# Patient Record
Sex: Female | Born: 1980 | Race: Asian | Hispanic: No | Marital: Married | Smoking: Never smoker
Health system: Southern US, Community
[De-identification: ages and names within clinical notes are randomized; demographics above are authoritative.]

## PROBLEM LIST (undated history)

## (undated) ENCOUNTER — Ambulatory Visit (HOSPITAL_COMMUNITY): Admission: EM | Payer: Medicaid Other | Source: Home / Self Care

## (undated) ENCOUNTER — Inpatient Hospital Stay (HOSPITAL_COMMUNITY): Payer: Self-pay

## (undated) DIAGNOSIS — M79606 Pain in leg, unspecified: Secondary | ICD-10-CM

## (undated) DIAGNOSIS — M549 Dorsalgia, unspecified: Secondary | ICD-10-CM

## (undated) DIAGNOSIS — R102 Pelvic and perineal pain: Secondary | ICD-10-CM

## (undated) DIAGNOSIS — Z789 Other specified health status: Secondary | ICD-10-CM

## (undated) DIAGNOSIS — M545 Low back pain: Secondary | ICD-10-CM

## (undated) DIAGNOSIS — G8929 Other chronic pain: Secondary | ICD-10-CM

## (undated) DIAGNOSIS — E079 Disorder of thyroid, unspecified: Secondary | ICD-10-CM

## (undated) HISTORY — DX: Other chronic pain: G89.29

## (undated) HISTORY — DX: Pain in leg, unspecified: M79.606

## (undated) HISTORY — DX: Dorsalgia, unspecified: M54.9

## (undated) HISTORY — DX: Pelvic and perineal pain: R10.2

## (undated) HISTORY — DX: Low back pain: M54.5

---

## 2012-02-26 ENCOUNTER — Emergency Department (HOSPITAL_COMMUNITY): Admission: EM | Admit: 2012-02-26 | Discharge: 2012-02-26 | Discharge status: Absent without leave | Payer: Self-pay

## 2013-12-06 ENCOUNTER — Encounter (HOSPITAL_COMMUNITY): Payer: Self-pay | Admitting: Emergency Medicine

## 2013-12-06 ENCOUNTER — Emergency Department (HOSPITAL_COMMUNITY): Payer: Self-pay

## 2013-12-06 ENCOUNTER — Emergency Department (HOSPITAL_COMMUNITY)
Admission: EM | Admit: 2013-12-06 | Discharge: 2013-12-06 | Disposition: A | Discharge status: Home / Self Care | Payer: Self-pay | Attending: Emergency Medicine | Admitting: Emergency Medicine

## 2013-12-06 DIAGNOSIS — M545 Low back pain, unspecified: Secondary | ICD-10-CM | POA: Insufficient documentation

## 2013-12-06 DIAGNOSIS — Z3202 Encounter for pregnancy test, result negative: Secondary | ICD-10-CM | POA: Insufficient documentation

## 2013-12-06 DIAGNOSIS — R109 Unspecified abdominal pain: Secondary | ICD-10-CM | POA: Insufficient documentation

## 2013-12-06 LAB — POC URINE PREG, ED: Preg Test, Ur: NEGATIVE

## 2013-12-06 LAB — CBC WITH DIFFERENTIAL/PLATELET
BASOS ABS: 0 10*3/uL (ref 0.0–0.1)
Basophils Relative: 0 % (ref 0–1)
Eosinophils Absolute: 0.4 10*3/uL (ref 0.0–0.7)
Eosinophils Relative: 3 % (ref 0–5)
HEMATOCRIT: 32.4 % — AB (ref 36.0–46.0)
Hemoglobin: 10.5 g/dL — ABNORMAL LOW (ref 12.0–15.0)
LYMPHS PCT: 16 % (ref 12–46)
Lymphs Abs: 1.8 10*3/uL (ref 0.7–4.0)
MCH: 26.4 pg (ref 26.0–34.0)
MCHC: 32.4 g/dL (ref 30.0–36.0)
MCV: 81.6 fL (ref 78.0–100.0)
MONO ABS: 0.7 10*3/uL (ref 0.1–1.0)
Monocytes Relative: 6 % (ref 3–12)
NEUTROS ABS: 8.7 10*3/uL — AB (ref 1.7–7.7)
NEUTROS PCT: 75 % (ref 43–77)
Platelets: 286 10*3/uL (ref 150–400)
RBC: 3.97 MIL/uL (ref 3.87–5.11)
RDW: 14.3 % (ref 11.5–15.5)
WBC: 11.5 10*3/uL — AB (ref 4.0–10.5)

## 2013-12-06 LAB — URINALYSIS, ROUTINE W REFLEX MICROSCOPIC
Bilirubin Urine: NEGATIVE
GLUCOSE, UA: NEGATIVE mg/dL
HGB URINE DIPSTICK: NEGATIVE
KETONES UR: NEGATIVE mg/dL
Leukocytes, UA: NEGATIVE
Nitrite: NEGATIVE
PH: 6.5 (ref 5.0–8.0)
PROTEIN: NEGATIVE mg/dL
Specific Gravity, Urine: 1.001 — ABNORMAL LOW (ref 1.005–1.030)
Urobilinogen, UA: 0.2 mg/dL (ref 0.0–1.0)

## 2013-12-06 LAB — COMPREHENSIVE METABOLIC PANEL
ALK PHOS: 50 U/L (ref 39–117)
ALT: 12 U/L (ref 0–35)
AST: 16 U/L (ref 0–37)
Albumin: 4 g/dL (ref 3.5–5.2)
BUN: 7 mg/dL (ref 6–23)
CHLORIDE: 99 meq/L (ref 96–112)
CO2: 25 meq/L (ref 19–32)
Calcium: 9.4 mg/dL (ref 8.4–10.5)
Creatinine, Ser: 0.57 mg/dL (ref 0.50–1.10)
GFR calc Af Amer: >90 mL/min (ref >90)
Glucose, Bld: 131 mg/dL — ABNORMAL HIGH (ref 70–99)
POTASSIUM: 3.8 meq/L (ref 3.7–5.3)
Sodium: 137 mEq/L (ref 137–147)
Total Bilirubin: 0.3 mg/dL (ref 0.3–1.2)
Total Protein: 7.4 g/dL (ref 6.0–8.3)

## 2013-12-06 LAB — LIPASE, BLOOD: Lipase: 41 U/L (ref 11–59)

## 2013-12-06 MED ORDER — NAPROXEN 500 MG PO TABS: 500.0000 mg | ORAL_TABLET | Freq: Two times a day (BID) | ORAL | Status: DC

## 2013-12-06 MED ORDER — FAMOTIDINE 20 MG PO TABS: 20.0000 mg | ORAL_TABLET | Freq: Two times a day (BID) | ORAL | Status: DC

## 2013-12-06 MED ORDER — GI COCKTAIL ~~LOC~~
30.0000 mL | Freq: Once | ORAL | Status: AC
  Administered 2013-12-06: 30 mL via ORAL
  Filled 2013-12-06: qty 30

## 2013-12-06 MED ORDER — KETOROLAC TROMETHAMINE 15 MG/ML IJ SOLN
30.0000 mg | Freq: Once | INTRAMUSCULAR | Status: AC
  Administered 2013-12-06: 30 mg via INTRAVENOUS
  Filled 2013-12-06: qty 2

## 2013-12-06 NOTE — Discharge Instructions (Signed)
Abdominal Pain, Adult °Many things can cause abdominal pain. Usually, abdominal pain is not caused by a disease and will improve without treatment. It can often be observed and treated at home. Your health care provider will do a physical exam and possibly order blood tests and X-rays to help determine the seriousness of your pain. However, in many cases, more time must pass before a clear cause of the pain can be found. Before that point, your health care provider may not know if you need more testing or further treatment. °HOME CARE INSTRUCTIONS  °Monitor your abdominal pain for any changes. The following actions may help to alleviate any discomfort you are experiencing: °· Only take over-the-counter or prescription medicines as directed by your health care provider. °· Do not take laxatives unless directed to do so by your health care provider. °· Try a clear liquid diet (broth, tea, or water) as directed by your health care provider. Slowly move to a bland diet as tolerated. °SEEK MEDICAL CARE IF: °· You have unexplained abdominal pain. °· You have abdominal pain associated with nausea or diarrhea. °· You have pain when you urinate or have a bowel movement. °· You experience abdominal pain that wakes you in the night. °· You have abdominal pain that is worsened or improved by eating food. °· You have abdominal pain that is worsened with eating fatty foods. °SEEK IMMEDIATE MEDICAL CARE IF:  °· Your pain does not go away within 2 hours. °· You have a fever. °· You keep throwing up (vomiting). °· Your pain is felt only in portions of the abdomen, such as the right side or the left lower portion of the abdomen. °· You pass bloody or black tarry stools. °MAKE SURE YOU: °· Understand these instructions.   °· Will watch your condition.   °· Will get help right away if you are not doing well or get worse.   °Document Released: 06/27/2005 Document Revised: 07/08/2013 Document Reviewed: 05/27/2013 °Exit[employer]® Patient  Information ©2014 [employer]. ° °

## 2013-12-06 NOTE — ED Notes (Signed)
Bed: WA01 Expected date: 12/05/13 Expected time: 11:57 PM Means of arrival: Ambulance Comments: Back pain/abd pain

## 2013-12-06 NOTE — ED Provider Notes (Signed)
CSN: [id number]     Arrival date & time 12/06/13  0014 History   First MD Initiated Contact with Patient 12/06/13 0030     Chief Complaint  Patient presents with  . Abdominal Pain  . Back Pain     (Consider location/radiation/quality/duration/timing/severity/associated sxs/prior Treatment) HPI History provided by patient and her husband.  Left flank pain onset tonight seems to radiate somewhat the left lower quadrant abdomen. No associated nausea vomiting or diarrhea. Pain was moderate in severity now mild. Has some associated low back pain unchanged over the last few years. No fevers or chills. No dysuria, urgency or frequency. Has not noticed any hematuria. No history of kidney stones. No trauma. Patient currently declines any pain medications of Tylenol prior to arrival. Pain unaffected by movement. No heartburn. No belching. No migrating pain.   History reviewed. No pertinent past medical history. History reviewed. No pertinent past surgical history. History reviewed. No pertinent family history. History  Substance Use Topics  . Smoking status: Never Smoker   . Smokeless tobacco: Not on file  . Alcohol Use: No   OB History   Grav Para Term Preterm Abortions TAB SAB Ect Mult Living                 Review of Systems  Constitutional: Negative for fever and chills.  Respiratory: Negative for cough and shortness of breath.   Cardiovascular: Negative for chest pain.  Gastrointestinal: Positive for abdominal pain. Negative for vomiting and blood in stool.  Genitourinary: Negative for dysuria.  Musculoskeletal: Negative for neck pain.  Skin: Negative for rash.  Neurological: Negative for headaches.  All other systems reviewed and are negative.      Allergies  Review of patient's allergies indicates no known allergies.  Home Medications   Current Outpatient Rx  Name  Route  Sig  Dispense  Refill  . acetaminophen (TYLENOL) 500 MG tablet   Oral   Take 500 mg by mouth  every 6 (six) hours as needed.          BP 110/87  Pulse 90  Temp(Src) 98.7 F (37.1 C) (Oral)  Resp 18  SpO2 100%  LMP 11/25/2013 Physical Exam  Constitutional: She is oriented to person, place, and time. She appears well-developed and well-nourished.  HENT:  Head: Normocephalic and atraumatic.  Eyes: EOM are normal. Pupils are equal, round, and reactive to light. No scleral icterus.  Neck: Neck supple.  Cardiovascular: Normal rate, regular rhythm and intact distal pulses.   Pulmonary/Chest: Effort normal and breath sounds normal. No respiratory distress.  Abdominal: Soft. Bowel sounds are normal. She exhibits no distension. There is no rebound.  Mild left upper quadrant and left flank tenderness. No CVA tenderness. No acute abdomen.  Musculoskeletal: Normal range of motion. She exhibits no edema and no tenderness.  No thoracic or lumbar tenderness. No CVA tenderness.  Neurological: She is alert and oriented to person, place, and time. No cranial nerve deficit.  Skin: Skin is warm and dry. No rash noted.    ED Course  Procedures (including critical care time) Labs Review Labs Reviewed  CBC WITH DIFFERENTIAL - Abnormal; Notable for the following:    WBC 11.5 (*)    Hemoglobin 10.5 (*)    HCT 32.4 (*)    Neutro Abs 8.7 (*)    All other components within normal limits  COMPREHENSIVE METABOLIC PANEL - Abnormal; Notable for the following:    Glucose, Bld 131 (*)    All other components  within normal limits  URINALYSIS, ROUTINE W REFLEX MICROSCOPIC - Abnormal; Notable for the following:    Specific Gravity, Urine 1.001 (*)    All other components within normal limits  LIPASE, BLOOD  POC URINE PREG, ED   Imaging Review Ct Abdomen Pelvis Wo Contrast  12/06/2013   CLINICAL DATA:  Left flank pain.  EXAM: CT ABDOMEN AND PELVIS WITHOUT CONTRAST  TECHNIQUE: Multidetector CT imaging of the abdomen and pelvis was performed following the standard protocol without intravenous  contrast.  COMPARISON:  None.  FINDINGS: BODY WALL: Unremarkable.  LOWER CHEST: Unremarkable.  ABDOMEN/PELVIS:  Liver: No focal abnormality.  Biliary: No evidence of biliary obstruction or stone.  Pancreas: Unremarkable.  Spleen: Unremarkable.  Adrenals: Unremarkable.  Kidneys and ureters: No hydronephrosis or stone.  Bladder: Unremarkable.  Reproductive: Unremarkable.  Bowel: No obstruction. Normal appendix.  Retroperitoneum: No mass or adenopathy.  Peritoneum: No free fluid or gas.  Vascular: No acute abnormality.  OSSEOUS: No acute abnormalities.  IMPRESSION: No identifiable cause of flank pain. No hydronephrosis or urolithiasis.   Electronically Signed   By: Tiburcio PeaJonathan  Watts M.D.   On: 12/06/2013 01:56   IV Toradol provided. GI cocktail.  4:00 AM patient feels completely better and would like to be discharged home.  Labs and imaging reviewed with patient. Plan prescription for Pepcid and Naprosyn. Outpatient referral provided. Return precautions provided and verbalizes understood. No indication for admission or further workup at this time in the ER.  MDM   Diagnosis: Abdominal pain, flank pain  Left upper quadrant and left flank pain onset tonight. No acute abdomen.   Evaluated with labs, urinalysis and imaging reviewed as above. Medications provided and pain resolved Vital signs and nursing notes reviewed and considered.    Sunnie NielsenBrian Tauna Macfarlane, MD 12/06/13 [phone]

## 2013-12-06 NOTE — ED Notes (Signed)
Per EMS , pt. From home with complaint of left upper quadrant abdominal pain which started at 11pm this evening at 101/10, also with chronic back pain for 5 years. Pt. Denies N/V, denies dysuria, denies dizziness.

## 2014-07-06 ENCOUNTER — Ambulatory Visit (INDEPENDENT_AMBULATORY_CARE_PROVIDER_SITE_OTHER): Payer: 59 | Admitting: Medical

## 2014-07-06 ENCOUNTER — Ambulatory Visit
Admission: RE | Admit: 2014-07-06 | Discharge: 2014-07-06 | Disposition: A | Discharge status: Home / Self Care | Payer: 59 | Source: Ambulatory Visit | Attending: Medical | Admitting: Medical

## 2014-07-06 ENCOUNTER — Telehealth: Payer: Self-pay | Admitting: Medical

## 2014-07-06 ENCOUNTER — Encounter: Payer: Self-pay | Admitting: Medical

## 2014-07-06 ENCOUNTER — Other Ambulatory Visit: Payer: Self-pay | Admitting: Family Medicine

## 2014-07-06 VITALS — BP 100/70 | HR 72 | Temp 98.5°F | Resp 16 | Ht 59.0 in | Wt 126.0 lb

## 2014-07-06 DIAGNOSIS — M25552 Pain in left hip: Secondary | ICD-10-CM

## 2014-07-06 DIAGNOSIS — M79602 Pain in left arm: Secondary | ICD-10-CM

## 2014-07-06 DIAGNOSIS — Z01419 Encounter for gynecological examination (general) (routine) without abnormal findings: Secondary | ICD-10-CM

## 2014-07-06 DIAGNOSIS — G8929 Other chronic pain: Secondary | ICD-10-CM

## 2014-07-06 DIAGNOSIS — M79605 Pain in left leg: Secondary | ICD-10-CM

## 2014-07-06 DIAGNOSIS — M25562 Pain in left knee: Secondary | ICD-10-CM

## 2014-07-06 DIAGNOSIS — K5900 Constipation, unspecified: Secondary | ICD-10-CM

## 2014-07-06 DIAGNOSIS — Z3002 Counseling and instruction in natural family planning to avoid pregnancy: Secondary | ICD-10-CM

## 2014-07-06 DIAGNOSIS — M549 Dorsalgia, unspecified: Secondary | ICD-10-CM

## 2014-07-06 NOTE — Telephone Encounter (Signed)
I referred the patient to Dr. Hyacinth MeekerMiller and her office will contact the patient for her appointment

## 2014-07-06 NOTE — Progress Notes (Signed)
Subjective: Here as a new patient today, establishing care along with her husband.   They have been in [place] 3 years, in [place] 15 years, from [place] originally.   This is there first primary medical care other than ED visit in [place].  She has several concerns.  Husband provides the translation as her English is limited.     She reports 2 children, both C-section, youngest is 33yo.  She notes that even with birth of her first child, has had ongoing low back pain, left leg pain, left hip pain, and left knee pain since.   Housework aggravates the pain, walking or standing for long periods aggravates the pain.   Pain does radiate down left leg all the way to the feet.  Denies tingling or numbness or weakness.   Main c/o is pain, up to 10/10, intermittent but daily.  She has been evaluated in the emergency dept in [place] 3 years ago for the same.  Diagnosis unknown.  Has seen PT in the past with some improvement.  Uses Tylenol most days for the pain without relief.  She stretches sometimes. Walks for exercise.   Denies knee swelling, but does report left knee pains intermittent, ongoing.    Gets intermittent abdominal pain, gas, bloating, and sometimes hard to pass stool.  Gets constipated.   Using nothing for this.  Eats fiber, eats rice and curry every meal, eats vegetables.  drinks some water.   No vagina c/o, no unusual vaginal bleeding, no urinary c/o today, no stool changes otherwise.   No blood in stool.  No fever, no recent weight changes, night sweats.   No other aggravating or relieving factors.    Wants referral to gynecologist for birth control and exam, other female concerns.   ROS as in subjective   Objective: BP 100/70  Pulse 72  Temp(Src) 98.5 F (36.9 C) (Oral)  Resp 16  Ht 4\' 11"  (1.499 m)  Wt 126 lb (57.153 kg)  BMI 25.44 kg/m2  General appearance: alert, no distress, WD/WN, [place] female Abdomen: +bs, soft, non tender, non distended, no masses, no  hepatomegaly, no splenomegaly Back: nontender, pain with flexion and extension, but ROM 90% of normal, no scoliosis, no obvious hip/leg length discrepancy MSK: mild pain with left knee flexion and with meniscal test without obvious click or pop, pain in upper left thigh with hip flexion, pain with resisted hip flexion and adduction on the left.   otherwise leg nontender, normal ROM.   Rest of LE intact. Neuro: normal heel and toe walk, normal gait, normal leg strength, normal DTRs of LE, sensation normal, +SLR at 30 degrees on the left Pulses: 2+ symmetric, upper and lower extremities, normal cap refill Ext: no edema    Assessment: Encounter Diagnoses  Name Primary?  . Back pain, chronic Yes  . Hip pain, left   . Leg pain, diffuse, left   . Knee pain, chronic, left   . Constipation, unspecified constipation type   . Counseling and instruction in natural family planning to avoid pregnancy    Plan: Back pain, hip pain, leg pain - likely anatomical changes related to giving birth.   Will send for xrays of L spine and pelvis.   Pending xrays, likely consider referral to physical therapy.   Can use OTC NSAID prn.  Discussed possibility of bulging disc or other abnormality, which we can revisit pending xrays and next steps.  Knee pain - unclear etiology.  Exam unremarkable, and hard to discern given  the language barrier, but I suspect more related to the back and leg pain c/o.  constipation - discussed diagnosis, treatment, prevention, increase water intake, c/t good fiber intake, and can use Miralax 1 cap full daily in beverage. She will try some of husbands prescription since I prescribed Miralax for him today for similar c/o constipation.   Referral to gynecology at her request.   F/u pending xrays.

## 2014-07-06 NOTE — Telephone Encounter (Signed)
Referral to gynecology for contraception and routine gynecological exam

## 2014-07-08 ENCOUNTER — Telehealth: Payer: Self-pay | Admitting: Obstetrics & Gynecology

## 2014-07-08 NOTE — Telephone Encounter (Signed)
Patient was referred to our office for routine care as a new patient. Left patient a message to call back and schedule. Used the [employer] [phone] to call the patient and leave message. Code: [id number].

## 2014-07-13 NOTE — Telephone Encounter (Signed)
Scheduled

## 2014-09-09 ENCOUNTER — Ambulatory Visit (INDEPENDENT_AMBULATORY_CARE_PROVIDER_SITE_OTHER): Payer: 59 | Admitting: Obstetrics & Gynecology

## 2014-09-09 ENCOUNTER — Encounter: Payer: Self-pay | Admitting: Obstetrics & Gynecology

## 2014-09-09 VITALS — BP 124/68 | HR 60 | Resp 16 | Ht 59.25 in | Wt 124.0 lb

## 2014-09-09 DIAGNOSIS — Z01419 Encounter for gynecological examination (general) (routine) without abnormal findings: Secondary | ICD-10-CM

## 2014-09-09 DIAGNOSIS — N912 Amenorrhea, unspecified: Secondary | ICD-10-CM

## 2014-09-09 DIAGNOSIS — M5432 Sciatica, left side: Secondary | ICD-10-CM

## 2014-09-09 DIAGNOSIS — N938 Other specified abnormal uterine and vaginal bleeding: Secondary | ICD-10-CM

## 2014-09-09 DIAGNOSIS — Z90712 Acquired absence of cervix with remaining uterus: Secondary | ICD-10-CM

## 2014-09-09 DIAGNOSIS — Z30019 Encounter for initial prescription of contraceptives, unspecified: Secondary | ICD-10-CM

## 2014-09-09 DIAGNOSIS — Z1272 Encounter for screening for malignant neoplasm of vagina: Secondary | ICD-10-CM

## 2014-09-09 LAB — POCT URINE PREGNANCY: Preg Test, Ur: NEGATIVE

## 2014-09-09 MED ORDER — NORETHIN ACE-ETH ESTRAD-FE 1-20 MG-MCG PO TABS: 1.0000 | ORAL_TABLET | Freq: Every day | ORAL | Status: DC

## 2014-09-09 MED ORDER — IBUPROFEN 800 MG PO TABS: 800.0000 mg | ORAL_TABLET | Freq: Three times a day (TID) | ORAL | Status: DC | PRN

## 2014-09-09 NOTE — Progress Notes (Signed)
33 y.o. [id number] Married F originally from [place] here for evaluation of pelvic, back, leg pain.  Pt is from [place] and does have a fair understanding of English.  Her husband helps translate.  Declines additional translator.    Pt reports the pain is primarily on the left and over the buttocks.  This comes down over the back of her thigh as well.  Occasionally she does have some left foot pain.  Pain has been going off and on for several years.  Is worse lately.  Worse with standing.    Feels it may be a little worse with cycles.  Cycles fairly regular.  Pt removes.  No bleeding with intercourse.    Referred from S. Tysinger.    Patient's last menstrual period was 08/28/2014.          Sexually active: Yes.    The current method of family planning is none.    Exercising: Yes.    walking Smoker:  no  Health Maintenance: Pap:  never History of abnormal Pap:  no MMG:  never Colonoscopy:  never BMD:   never TDaP:  ? PCP Screening Labs: PCP, Hb today: PCP, Urine today: PCP   reports that she has never smoked. She has never used smokeless tobacco. She reports that she does not drink alcohol or use illicit drugs.  Past Medical History  Diagnosis Date  . Chronic back pain     since childbirth, x several years as of 07/2014  . Chronic leg pain     left along with hip and back pain since childbirth as of 07/2014    Past Surgical History  Procedure Laterality Date  . Cesarean section      x2    Current Outpatient Prescriptions  Medication Sig Dispense Refill  . acetaminophen (TYLENOL) 500 MG tablet Take 500 mg by mouth every 6 (six) hours as needed.     No current facility-administered medications for this visit.    History reviewed. No pertinent family history.  ROS:  Pertinent items are noted in HPI.  Otherwise, a comprehensive ROS was negative.  Exam:   LMP 08/28/2014      Ht Readings from Last 3 Encounters:  07/06/14 4\' 11"  (1.499 m)    General appearance: alert,  cooperative and appears stated age Head: Normocephalic, without obvious abnormality, atraumatic Neck: no adenopathy, supple, symmetrical, trachea midline and thyroid normal to inspection and palpation Lungs: clear to auscultation bilaterally Breasts: normal appearance, no masses or tenderness Heart: regular rate and rhythm Abdomen: soft, non-tender; bowel sounds normal; no masses,  no organomegaly Extremities: extremities normal, atraumatic, no cyanosis or edema Skin: Skin color, texture, turgor normal. No rashes or lesions Lymph nodes: Cervical, supraclavicular, and axillary nodes normal. No abnormal inguinal nodes palpated Neurologic: Grossly normal   Pelvic: External genitalia:  no lesions              Urethra:  normal appearing urethra with no masses, tenderness or lesions              Bartholins and Skenes: normal                 Vagina: normal appearing vagina with normal color and discharge, no lesions              Cervix: no lesions              Pap taken: Yes.   Bimanual Exam:  Uterus:  normal size, contour, position, consistency, mobility, non-tender  Adnexa: normal adnexa and no mass, fullness, tenderness               Rectovaginal: Confirms               Anus:  normal sphincter tone, no lesions  Husband present as well as CMA for entire exam  A:  Well Woman with normal exam Buttocks pain, more consistent with sciatica Desire to start OCP for cycle control/contraception  P:   Mammogram starting age 33 discussed. pap smear with HR HPV obtained today. Referral to ortho Starting Junel 1/20 daily.  Instructions for use given.  Side effects/risks of DVT/PE, hypertension, headache, nausea, and DUB while transition to pill discussed.  Clear understanding of risks noted.  Pt will start with onset of next cycle.   Recheck 3 months for BP check. return annually or prn  In addition to AEX, about 15 minutes spent discussing back/leg problems and contraception  needs.  An After Visit Summary was printed and given to the patient.

## 2014-09-11 LAB — IPS PAP TEST WITH HPV

## 2014-09-20 ENCOUNTER — Encounter: Payer: Self-pay | Admitting: Obstetrics & Gynecology

## 2014-11-30 ENCOUNTER — Telehealth: Payer: Self-pay | Admitting: Obstetrics & Gynecology

## 2014-11-30 NOTE — Telephone Encounter (Signed)
Left patient a message to call back to reschedule her 3 month recheck with Dr. Hyacinth MeekerMiller for 12/07/14.

## 2014-12-06 ENCOUNTER — Ambulatory Visit (INDEPENDENT_AMBULATORY_CARE_PROVIDER_SITE_OTHER): Payer: 59 | Admitting: Obstetrics & Gynecology

## 2014-12-06 VITALS — BP 104/68 | HR 64 | Resp 16 | Ht 59.25 in | Wt 129.2 lb

## 2014-12-06 DIAGNOSIS — Z3009 Encounter for other general counseling and advice on contraception: Secondary | ICD-10-CM | POA: Diagnosis not present

## 2014-12-06 DIAGNOSIS — N946 Dysmenorrhea, unspecified: Secondary | ICD-10-CM

## 2014-12-06 NOTE — Progress Notes (Signed)
Patient ID: Chelsea Cook, female   DOB: 10/21/1980, 34 y.o.   MRN: [id number]  34 yo G4P2 Married female from [place] here for discussion of starting OCPs.  Pt reports she began to have side effects from these including headaches, breast pain, and what she felt was "heart pain" so she stopped them.  Only on for two months at that time.  Symptoms have fully resolved.  Pt is accompanied by her husband which makes the interaction very difficult.  He tries to talk for her and over her and does not fully understand what I am asking, nor do I understand him fully.  I obtained a translator through the [employer] which frustrates him as he states he can fully understand me and does not need a Nurse, learning disabilitytranslator.  As I really do need to communicate with the patient, advised it was in her best interest to have a translator.  He did finally consent to one although she had initially.  Confirmed all of the above with pt.  Interpreter [phone].  [place] language used.  Pt and spouse are very sure they do not want any more children at this time so want to discuss contraception options.  BTL, vasectomy (which he is NOT going to consider), OCPs--combination and progesterone only options, Depo Provera, and IUD use all discussed.  As pt have increased cramps with her cycles, she is most interested in IUD placement.  Timing, risks and benefits discussed with pt.  Specifically she is aware of the revesibility of this method, in case she changes her mind.  DUB for first few months reviewed as well.  Pt and spouse are pretty sure they would like to proceed with this.  Pt knows she needs to call with onset of cycle for placement.  On another topic, pt did not go see ortho and now is not interested in proceeding with this.    Assessment:  Dysmenorrhea Desired contraception  Plan:  Pt will call with onset of cycle for IUD placement.  ~30 minutes spent with patient >50% of time was in face to face discussion of above.  This includes  translator time.

## 2014-12-07 ENCOUNTER — Ambulatory Visit: Payer: 59 | Admitting: Obstetrics & Gynecology

## 2014-12-19 ENCOUNTER — Encounter: Payer: Self-pay | Admitting: Obstetrics & Gynecology

## 2014-12-19 DIAGNOSIS — N946 Dysmenorrhea, unspecified: Secondary | ICD-10-CM | POA: Insufficient documentation

## 2014-12-20 ENCOUNTER — Telehealth: Payer: Self-pay | Admitting: Obstetrics & Gynecology

## 2014-12-20 NOTE — Telephone Encounter (Signed)
Spoke with patient spouse. Advised of benefit quote received for IUD insertion. Patient is to call with onset of cycle.

## 2015-11-15 ENCOUNTER — Ambulatory Visit
Admission: RE | Admit: 2015-11-15 | Discharge: 2015-11-15 | Disposition: A | Discharge status: Home / Self Care | Payer: BLUE CROSS/BLUE SHIELD | Source: Ambulatory Visit | Attending: Medical | Admitting: Medical

## 2015-11-15 ENCOUNTER — Other Ambulatory Visit: Payer: Self-pay | Admitting: Medical

## 2015-11-15 ENCOUNTER — Ambulatory Visit (INDEPENDENT_AMBULATORY_CARE_PROVIDER_SITE_OTHER): Payer: BLUE CROSS/BLUE SHIELD | Admitting: Medical

## 2015-11-15 ENCOUNTER — Encounter: Payer: Self-pay | Admitting: Medical

## 2015-11-15 VITALS — BP 116/70 | HR 59 | Ht 59.5 in | Wt 134.0 lb

## 2015-11-15 DIAGNOSIS — R102 Pelvic and perineal pain: Secondary | ICD-10-CM

## 2015-11-15 DIAGNOSIS — N644 Mastodynia: Secondary | ICD-10-CM | POA: Diagnosis not present

## 2015-11-15 DIAGNOSIS — M549 Dorsalgia, unspecified: Secondary | ICD-10-CM

## 2015-11-15 DIAGNOSIS — K5900 Constipation, unspecified: Secondary | ICD-10-CM

## 2015-11-15 DIAGNOSIS — Z0000 Encounter for general adult medical examination without abnormal findings: Secondary | ICD-10-CM

## 2015-11-15 DIAGNOSIS — Z23 Encounter for immunization: Secondary | ICD-10-CM

## 2015-11-15 DIAGNOSIS — M25561 Pain in right knee: Secondary | ICD-10-CM

## 2015-11-15 DIAGNOSIS — M545 Low back pain, unspecified: Secondary | ICD-10-CM

## 2015-11-15 DIAGNOSIS — M25562 Pain in left knee: Secondary | ICD-10-CM

## 2015-11-15 DIAGNOSIS — M546 Pain in thoracic spine: Secondary | ICD-10-CM | POA: Diagnosis not present

## 2015-11-15 DIAGNOSIS — G8929 Other chronic pain: Secondary | ICD-10-CM | POA: Insufficient documentation

## 2015-11-15 HISTORY — DX: Low back pain, unspecified: M54.50

## 2015-11-15 HISTORY — DX: Pelvic and perineal pain: R10.2

## 2015-11-15 HISTORY — DX: Dorsalgia, unspecified: M54.9

## 2015-11-15 LAB — COMPREHENSIVE METABOLIC PANEL
ALK PHOS: 54 U/L (ref 33–115)
ALT: 17 U/L (ref 6–29)
AST: 19 U/L (ref 10–30)
Albumin: 4.2 g/dL (ref 3.6–5.1)
BUN: 8 mg/dL (ref 7–25)
CO2: 29 mmol/L (ref 20–31)
CREATININE: 0.53 mg/dL (ref 0.50–1.10)
Calcium: 9.6 mg/dL (ref 8.6–10.2)
Chloride: 104 mmol/L (ref 98–110)
GLUCOSE: 81 mg/dL (ref 65–99)
Potassium: 4.7 mmol/L (ref 3.5–5.3)
Sodium: 137 mmol/L (ref 135–146)
Total Bilirubin: 0.4 mg/dL (ref 0.2–1.2)
Total Protein: 7.3 g/dL (ref 6.1–8.1)

## 2015-11-15 LAB — CBC WITH DIFFERENTIAL/PLATELET
BASOS ABS: 0.1 10*3/uL (ref 0.0–0.1)
BASOS PCT: 1 % (ref 0–1)
Eosinophils Absolute: 0.2 10*3/uL (ref 0.0–0.7)
Eosinophils Relative: 4 % (ref 0–5)
HCT: 33.2 % — ABNORMAL LOW (ref 36.0–46.0)
HEMOGLOBIN: 10.2 g/dL — AB (ref 12.0–15.0)
Lymphocytes Relative: 35 % (ref 12–46)
Lymphs Abs: 2.1 10*3/uL (ref 0.7–4.0)
MCH: 24.4 pg — ABNORMAL LOW (ref 26.0–34.0)
MCHC: 30.7 g/dL (ref 30.0–36.0)
MCV: 79.4 fL (ref 78.0–100.0)
MONOS PCT: 8 % (ref 3–12)
MPV: 11.7 fL (ref 8.6–12.4)
Monocytes Absolute: 0.5 10*3/uL (ref 0.1–1.0)
NEUTROS ABS: 3.1 10*3/uL (ref 1.7–7.7)
NEUTROS PCT: 52 % (ref 43–77)
Platelets: 275 10*3/uL (ref 150–400)
RBC: 4.18 MIL/uL (ref 3.87–5.11)
RDW: 15.3 % (ref 11.5–15.5)
WBC: 6 10*3/uL (ref 4.0–10.5)

## 2015-11-15 LAB — LIPID PANEL
Cholesterol: 143 mg/dL (ref 125–200)
HDL: 47 mg/dL (ref >=46)
LDL Cholesterol: 78 mg/dL (ref <130)
Total CHOL/HDL Ratio: 3 Ratio (ref <=5.0)
Triglycerides: 89 mg/dL (ref <150)
VLDL: 18 mg/dL (ref <30)

## 2015-11-15 LAB — POCT URINALYSIS DIPSTICK
Bilirubin, UA: NEGATIVE
Glucose, UA: NEGATIVE
Ketones, UA: NEGATIVE
LEUKOCYTES UA: NEGATIVE
NITRITE UA: NEGATIVE
PROTEIN UA: NEGATIVE
RBC UA: NEGATIVE
Spec Grav, UA: 1.025
UROBILINOGEN UA: NEGATIVE
pH, UA: 6

## 2015-11-15 LAB — HEMOGLOBIN A1C
HEMOGLOBIN A1C: 5.6 % (ref <5.7)
Mean Plasma Glucose: 114 mg/dL (ref <117)

## 2015-11-15 NOTE — Patient Instructions (Signed)
Encounter Diagnoses  Name Primary?  . Routine general medical examination at a health care facility Yes  . Need for prophylactic vaccination and inoculation against influenza   . Left knee pain   . Breast pain, right   . Constipation, unspecified constipation type   . Upper back pain   . Low back pain, unspecified back pain laterality, with sciatica presence unspecified   . Pelvic pain in female    Recommendations:  Go for xray of left knee.  You can do this today between 8am - 4:30pm  We updated your flu shot today  Breast pain, pelvic pain - we will refer you to gynecology for further evaluation and possible breast imaging  constipation - make sure you drink at least 2 liters of water daily, get fiber in the diet, and begin trial of Linzess once daily in the morning to help poop  Stretch and do exercises daily such as walking  We will call with lab results See your eye doctor yearly for routine vision care. See your dentist yearly for routine dental care including hygiene visits twice yearly.
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
## 2015-11-16 LAB — TSH: TSH: 8.35 mIU/L — ABNORMAL HIGH

## 2015-11-16 LAB — T4, FREE: Free T4: 0.9 ng/dL (ref 0.8–1.8)

## 2015-11-18 ENCOUNTER — Other Ambulatory Visit: Payer: Self-pay | Admitting: Medical

## 2015-11-18 MED ORDER — LEVOTHYROXINE SODIUM 50 MCG PO TABS: 50.0000 ug | ORAL_TABLET | Freq: Every day | ORAL | Status: DC

## 2015-11-27 ENCOUNTER — Emergency Department (HOSPITAL_COMMUNITY)
Admission: EM | Admit: 2015-11-27 | Discharge: 2015-11-27 | Disposition: A | Discharge status: Home / Self Care | Payer: BLUE CROSS/BLUE SHIELD | Attending: Emergency Medicine | Admitting: Emergency Medicine

## 2015-11-27 ENCOUNTER — Encounter (HOSPITAL_COMMUNITY): Payer: Self-pay

## 2015-11-27 DIAGNOSIS — M545 Low back pain, unspecified: Secondary | ICD-10-CM

## 2015-11-27 DIAGNOSIS — R0602 Shortness of breath: Secondary | ICD-10-CM | POA: Insufficient documentation

## 2015-11-27 DIAGNOSIS — R079 Chest pain, unspecified: Secondary | ICD-10-CM | POA: Diagnosis not present

## 2015-11-27 DIAGNOSIS — G8929 Other chronic pain: Secondary | ICD-10-CM | POA: Insufficient documentation

## 2015-11-27 DIAGNOSIS — Z79899 Other long term (current) drug therapy: Secondary | ICD-10-CM | POA: Diagnosis not present

## 2015-11-27 LAB — I-STAT TROPONIN, ED: Troponin i, poc: 0 ng/mL (ref 0.00–0.08)

## 2015-11-27 MED ORDER — HYDROCODONE-ACETAMINOPHEN 5-325 MG PO TABS: 2.0000 | ORAL_TABLET | ORAL | Status: DC | PRN

## 2015-11-27 MED ORDER — KETOROLAC TROMETHAMINE 60 MG/2ML IM SOLN
60.0000 mg | Freq: Once | INTRAMUSCULAR | Status: AC
  Administered 2015-11-27: 60 mg via INTRAMUSCULAR
  Filled 2015-11-27: qty 2

## 2015-11-27 NOTE — ED Notes (Signed)
Bed: [facility]3 Expected date:  Expected time:  Means of arrival:  Comments: EMS- Chronic back pain

## 2015-11-27 NOTE — ED Notes (Signed)
Per EMS, pt from home.  Pt c/o chronic lower back pain.  Started this morning.  Pt has pain meds and therapy already initiated.  No change in urination.  Vitals: 128/62, hr 80, resp 20, 100%

## 2015-11-27 NOTE — Discharge Instructions (Signed)
Back Pain, Adult Back pain is very common. The pain often gets better over time. The cause of back pain is usually not dangerous. Most people can learn to manage their back pain on their own.  HOME CARE  Watch your back pain for any changes. The following actions may help to lessen any pain you are feeling:  Stay active. Start with short walks on flat ground if you can. Try to walk farther each day.  Exercise regularly as told by your doctor. Exercise helps your back heal faster. It also helps avoid future injury by keeping your muscles strong and flexible.  Do not sit, drive, or stand in one place for more than 30 minutes.  Do not stay in bed. Resting more than 1-2 days can slow down your recovery.  Be careful when you bend or lift an object. Use good form when lifting:  Bend at your knees.  Keep the object close to your body.  Do not twist.  Sleep on a firm mattress. Lie on your side, and bend your knees. If you lie on your back, put a pillow under your knees.  Take medicines only as told by your doctor.  Put ice on the injured area.  Put ice in a plastic bag.  Place a towel between your skin and the bag.  Leave the ice on for 20 minutes, 2-3 times a day for the first 2-3 days. After that, you can switch between ice and heat packs.  Avoid feeling anxious or stressed. Find good ways to deal with stress, such as exercise.  Maintain a healthy weight. Extra weight puts stress on your back. GET HELP IF:   You have pain that does not go away with rest or medicine.  You have worsening pain that goes down into your legs or buttocks.  You have pain that does not get better in one week.  You have pain at night.  You lose weight.  You have a fever or chills. GET HELP RIGHT AWAY IF:   You cannot control when you poop (bowel movement) or pee (urinate).  Your arms or legs feel weak.  Your arms or legs lose feeling (numbness).  You feel sick to your stomach (nauseous) or  throw up (vomit).  You have belly (abdominal) pain.  You feel like you may pass out (faint).   This information is not intended to replace advice given to you by your health care provider. Make sure you discuss any questions you have with your health care provider.   Document Released: 03/05/2008 Document Revised: 10/08/2014 Document Reviewed: 01/19/2014 Elsevier Interactive Patient Education 2016 Elsevier Inc[employer]  Chest Wall Pain Chest wall pain is pain in or around the bones and muscles of your chest. Sometimes, an injury causes this pain. Sometimes, the cause may not be known. This pain may take several weeks or longer to get better. HOME CARE INSTRUCTIONS  Pay attention to any changes in your symptoms. Take these actions to help with your pain:   Rest as told by your health care provider.   Avoid activities that cause pain. These include any activities that use your chest muscles or your abdominal and side muscles to lift heavy items.   If directed, apply ice to the painful area:  Put ice in a plastic bag.  Place a towel between your skin and the bag.  Leave the ice on for 20 minutes, 2-3 times per day.  Take over-the-counter and prescription medicines only as told by your  health care provider.  Do not use tobacco products, including cigarettes, chewing tobacco, and e-cigarettes. If you need help quitting, ask your health care provider.  Keep all follow-up visits as told by your health care provider. This is important. SEEK MEDICAL CARE IF:  You have a fever.  Your chest pain becomes worse.  You have new symptoms. SEEK IMMEDIATE MEDICAL CARE IF:  You have nausea or vomiting.  You feel sweaty or light-headed.  You have a cough with phlegm (sputum) or you cough up blood.  You develop shortness of breath.   This information is not intended to replace advice given to you by your health care provider. Make sure you discuss any questions you have with your health care  provider.  Follow up with your primary care provider if your symptoms don't improve. Take pain medication as needed. Return to the emergency department if you experience any worsening of your symptoms, dizziness, difficulty breathing, increased chest pain, bowel or bladder incontinence, numbness or tingling in both of your extremities.

## 2015-11-27 NOTE — ED Notes (Signed)
Pt reports pain on r/side and back and recurrent headache x 2 months. Pt is alert, oriented and cooperative.

## 2015-11-28 NOTE — ED Provider Notes (Signed)
CSN: [id number]     Arrival date & time 11/27/15  5621 History   First MD Initiated Contact with Patient 11/27/15 1009     Chief Complaint  Patient presents with  . Back Pain     (Consider location/radiation/quality/duration/timing/severity/associated sxs/prior Treatment) Patient is a 35 y.o. female presenting with back pain. The history is limited by a language barrier. A language interpreter was used.  Back Pain    Chelsea Cook is a 35 y.o F with a pmhx of chronic back, breast and pelvic pain presents to the ED today c/o back and chest pain. Language interpreter was used, although still difficult to obtain adequate history from pt. Pt states that this morning she was walking down the stairs this morning and felt chest pain and back pain and mild shortness of breath. Pt states it was difficult for her to walk due to the pain. Pt states that this feels like her normal pain multiple times but then states that the chest pain is new. Pt is followed by her PCP for chronic pain, but states she only takes ibuprofen for this with minimal relief. Pt denies CP in the ED, but endorses back pain and R breast pain. Denies dizziness, syncope, LE swelling, vomiting, nausea, headache, bowel/bladder incontinence, saddle anesthesia.  Past Medical History  Diagnosis Date  . Chronic back pain     since childbirth, x several years as of 07/2014  . Chronic leg pain     left along with hip and back pain since childbirth as of 07/2014   Past Surgical History  Procedure Laterality Date  . Cesarean section      x2   Family History  Problem Relation Age of Onset  . Diabetes Father    Social History  Substance Use Topics  . Smoking status: Never Smoker   . Smokeless tobacco: Never Used  . Alcohol Use: No   OB History    Gravida Para Term Preterm AB TAB SAB Ectopic Multiple Living   Review of Systems  Musculoskeletal: Positive for back pain.  All other systems reviewed and are  negative.     Allergies  Review of patient's allergies indicates no known allergies.  Home Medications   Prior to Admission medications   Medication Sig Start Date End Date Taking? Authorizing Provider  acetaminophen (TYLENOL) 500 MG tablet Take 500 mg by mouth every 6 (six) hours as needed for moderate pain, fever or headache. Reported on 11/15/2015   Yes Historical Provider, MD  ibuprofen (ADVIL,MOTRIN) 200 MG tablet Take 400 mg by mouth every 6 (six) hours as needed for headache or moderate pain.   Yes Historical Provider, MD  levothyroxine (SYNTHROID) 50 MCG tablet Take 1 tablet (50 mcg total) by mouth daily before breakfast. 11/18/15  Yes Kermit Balo Tysinger, PA-C  Linaclotide (LINZESS) 145 MCG CAPS capsule Take 145 mcg by mouth daily.   Yes Historical Provider, MD  HYDROcodone-acetaminophen (NORCO/VICODIN) 5-325 MG tablet Take 2 tablets by mouth every 4 (four) hours as needed. 11/27/15   Holden Draughon Tripp Tetsuo Coppola, PA-C  norethindrone-ethinyl estradiol (JUNEL FE,GILDESS FE,LOESTRIN FE) 1-20 MG-MCG tablet Take 1 tablet by mouth daily. Patient not taking: Reported on 12/06/2014 09/09/14   Jerene Bears, MD   BP 95/58 mmHg  Pulse 70  Temp(Src) 98.3 F (36.8 C) (Oral)  Resp 17  SpO2 100%  LMP 10/25/2015 Physical Exam  Constitutional: She is oriented to person, place, and  time. She appears well-developed and well-nourished. No distress.  HENT:  Head: Normocephalic and atraumatic.  Mouth/Throat: No oropharyngeal exudate.  Eyes: Conjunctivae and EOM are normal. Pupils are equal, round, and reactive to light. Right eye exhibits no discharge. Left eye exhibits no discharge. No scleral icterus.  Neck: Normal range of motion. Neck supple.  Cardiovascular: Normal rate, regular rhythm, normal heart sounds and intact distal pulses.  Exam reveals no gallop and no friction rub.   No murmur heard. Pulmonary/Chest: Effort normal and breath sounds normal. No respiratory distress. She has no wheezes. She  has no rales. She exhibits no tenderness.  Abdominal: Soft. She exhibits no distension. There is no tenderness. There is no guarding.  Musculoskeletal: Normal range of motion. She exhibits no edema.  No midline spinal tenderness. Full range of motion of seek, T, L-spine. No step-offs or obvious bony deformities. Negative SLR.  Lymphadenopathy:    She has no cervical adenopathy.  Neurological: She is alert and oriented to person, place, and time. She has normal reflexes. No cranial nerve deficit. She exhibits normal muscle tone.  Strength 5/5 throughout. No sensory deficits.  No gait abnormality, patient ambulatory in ED without difficulty.  Skin: Skin is warm and dry. No rash noted. She is not diaphoretic. No erythema. No pallor.  Psychiatric: She has a normal mood and affect. Her behavior is normal.  Nursing note and vitals reviewed.   ED Course  Procedures (including critical care time) Labs Review Labs Reviewed  Rosezena Sensor, ED    Imaging Review No results found. I have personally reviewed and evaluated these images and lab results as part of my medical decision-making.   EKG Interpretation   Date/Time:  Sunday November 27 2015 11:11:56 EST Ventricular Rate:  67 PR Interval:  140 QRS Duration: 74 QT Interval:  366 QTC Calculation: 386 R Axis:   60 Text Interpretation:  Normal sinus rhythm Normal ECG Confirmed by DELO   MD, DOUGLAS ([id number]) on 11/27/2015 12:44:06 PM      MDM   Final diagnoses:  Chest pain, unspecified chest pain type  Right-sided low back pain without sciatica    35 year old female with chronic back, breast, pelvic pain presents to the ED complaining of back pain. Severe language barrier, language interpreter used but still difficult to obtain adequate history from patient. Patient's husband is also present and attempting to provide history, talking over patient and interpreter. Difficult to ascertain timeline of symptoms. Patient states she had  chest pain and back pain walking downstairs with associated mild shortness of breath. Patient states she had difficulty walking due to the pain, however patient is ambulatory in ED without difficulty, walking back and forth to the bathroom without complaints. No active chest pain in ED. Patient is PERC negative. EKG normal sinus rhythm. Troponin 0.0. Patient given Toradol injection with complete relief of her symptoms. Suspect this was a chronic pain exacerbation. Patient will follow up with her primary care doctor next week for reevaluation. Discussed in detail with patient and husband return precautions. Discussed that patient may require more than ibuprofen to treat her chronic pain and recommend pain management clinic for this. Patient is hemodynamically stable and ready for discharge.  Case discussed with Dr.Delo who agrees with treatment plan.    Chelsea Kinsman [place], PA-C 11/28/15 2245  Geoffery Lyons, MD 11/28/15 2351

## 2015-12-13 ENCOUNTER — Telehealth: Payer: Self-pay | Admitting: Obstetrics & Gynecology

## 2015-12-13 NOTE — Telephone Encounter (Signed)
Spoke with patient's husband Ranjit, okay per ROI. Husband states that the patient's LMP was 11/09/2015. She has not started her cycle for this month. Took UPT which was negative. Reports the patient would like to come in to see Dr.Miller for evaluation and discussion of missed cycle. Husband states they are only available for an appointment after 3 pm. Appointment scheduled for 12/16/2015 at 3:30 pm with Dr.Miller. He is agreeable to date and time.  Routing to provider for final review. Patient agreeable to disposition. Will close encounter.

## 2015-12-13 NOTE — Telephone Encounter (Signed)
Patient's spouse Ranjit (dpr on file) called on patient's behalf because has not had a period this month. She has taken a pregnancy test which was negative.

## 2015-12-16 ENCOUNTER — Ambulatory Visit (INDEPENDENT_AMBULATORY_CARE_PROVIDER_SITE_OTHER): Payer: BLUE CROSS/BLUE SHIELD | Admitting: Obstetrics & Gynecology

## 2015-12-16 VITALS — BP 100/66 | HR 90 | Resp 16 | Ht 59.5 in | Wt 135.0 lb

## 2015-12-16 DIAGNOSIS — R102 Pelvic and perineal pain: Secondary | ICD-10-CM | POA: Diagnosis not present

## 2015-12-16 DIAGNOSIS — M545 Low back pain, unspecified: Secondary | ICD-10-CM

## 2015-12-16 DIAGNOSIS — N898 Other specified noninflammatory disorders of vagina: Secondary | ICD-10-CM

## 2015-12-16 MED ORDER — DROSPIRENONE-ETHINYL ESTRADIOL 3-0.02 MG PO TABS
1.0000 | ORAL_TABLET | Freq: Every day | ORAL | Status: DC
Start: 2015-12-16 — End: 2016-06-08

## 2015-12-16 MED ORDER — MEDROXYPROGESTERONE ACETATE 10 MG PO TABS: 10.0000 mg | ORAL_TABLET | Freq: Every day | ORAL | Status: DC

## 2015-12-16 NOTE — Progress Notes (Deleted)
Subjective:     Patient ID: Chelsea Cook, female   DOB: 02/26/1981, 35 y.o.   MRN: [id number]  HPI 35 yo G2P2bM [place] female here for complaint of several issues.  First, she missed her cycle this month.  She is anxious about this.  Pt using condoms only for birth control.  She and I have discussed this in the past.  She does not want to continue using condoms.  She has been on OCPs in the past.  She states this is something she would like to start again.  Pt is aware UPT is negative today.     She is concerned she had a pelvic infection   [facility]ire conversation with pt [facility] [phone]#[id number].   Review of Systems     Objective:   Physical Exam     Assessment:     Pt missed one cycle and is desirous of     Plan:     Provera 10mg  x 10 days.  Once she starts her cycle, she will transition to [place].  Risks and side effects discussed. GC/Chl and affirm pending.

## 2015-12-17 ENCOUNTER — Encounter: Payer: Self-pay | Admitting: Obstetrics & Gynecology

## 2015-12-17 LAB — WET PREP BY MOLECULAR PROBE
Candida species: NEGATIVE
Gardnerella vaginalis: NEGATIVE
Trichomonas vaginosis: NEGATIVE

## 2015-12-17 NOTE — Progress Notes (Signed)
Patient ID: Chelsea Cook, female   DOB: 09/24/1981, 35 y.o.   MRN: [id number]   HPI: 35 yo G2P2bM [place] female here for complaint of several issues.  First, she missed her cycle this month.  She is anxious about this.  Pt using condoms only for birth control.  She typically has regular cycles that last 5-6 days.  She and I have discussed this in the past.  She does not want to continue using condoms.  She has been on OCPs in the past.  She states this is something she would like to start again.  Pt is aware UPT is negative today.    Pt reports continued left lower back pain.  She reported this the first time I saw her in 12/15.  She started after the birth of her last child.  Her children are now 5 and 8.  It was recommended that she see ortho but she never did.  She does not take anything for this.  It does sometimes feel like the pain comes through to her pelvis.  She is concerned she had a pelvic infection as well.  Has some discharge without odor.  Denies other sexual partners.  Feels some pelvic cramping.  Denies fever. Pt had wound infection after last cesarean section and she has stated concern about possible infection in the past, as well as today.  D/w pt  Entire conversation with pt occurred with [place] interpreter [phone].  Interpreter was also on the phone during entire exam and explanation of recommendations.  GYNECOLOGIC HISTORY: Patient's last menstrual period was 10/25/2015. Contraception: condoms only  Patient Active Problem List   Diagnosis Date Noted  . Need for prophylactic vaccination and inoculation against influenza 11/15/2015  . Routine general medical examination at a health care facility 11/15/2015  . Left knee pain 11/15/2015  . Breast pain, right 11/15/2015  . Upper back pain 11/15/2015  . Low back pain 11/15/2015  . Pelvic pain in female 11/15/2015  . Constipation 11/15/2015  . Dysmenorrhea 12/19/2014    Past Medical History  Diagnosis Date  . Chronic back pain      since childbirth, x several years as of 07/2014  . Chronic leg pain     left along with hip and back pain since childbirth as of 07/2014    Past Surgical History  Procedure Laterality Date  . Cesarean section      x2    MEDS:  Reviewed in EPIC and UTD  ALLERGIES: Review of patient's allergies indicates no known allergies.  Family History  Problem Relation Age of Onset  . Diabetes Father     SH:  Married, non smoker  ROS  PHYSICAL EXAMINATION:    BP 100/66 mmHg  Pulse 90  Resp 16  Ht 4' 11.5" (1.511 m)  Wt 135 lb (61.236 kg)  BMI 26.82 kg/m2  LMP 10/25/2015     General appearance: alert, cooperative and appears stated age Neck: no adenopathy, supple, symmetrical, trachea midline and thyroid normal to inspection and palpation CV:  Regular rate and rhythm Lungs:  clear to auscultation, no wheezes, rales or rhonchi, symmetric air entry Breasts: normal appearance, no masses or tenderness Abdomen: soft, non-tender; bowel sounds normal; no masses,  no organomegaly  Pelvic: External genitalia:  no lesions              Urethra:  normal appearing urethra with no masses, tenderness or lesions              Bartholins  and Skenes: normal                 Vagina: normal appearing vagina with normal color and discharge, no lesions              Cervix: no lesions              Bimanual Exam:  Uterus:  normal size, contour, position, consistency, mobility, non-tender              Adnexa: no mass, fullness, tenderness              Anus:  normal sphincter tone, no lesions  Chaperone was present for exam.  Assessment: Missed menstrual cycle, negative UPT today Vaginal discharge Condom use, pt desirous of additional contraception Low back pain  Plan: Provera  x 10 days.  With onset of cycle, pt will start OCP.  Yaz rx given.  Risks and benefits reviewed.  Have discussed additional contracpetion options in the past.  Pt declines for now. Referral to ortho will be placed.   Feel massage, PT would help pt Affirm, GC/Chl pending Pap obtained as well.   ~35 minutes spent with patient >50% of time was in face to face discussion of above.

## 2015-12-19 ENCOUNTER — Telehealth: Payer: Self-pay

## 2015-12-19 ENCOUNTER — Telehealth: Payer: Self-pay | Admitting: Obstetrics & Gynecology

## 2015-12-19 NOTE — Telephone Encounter (Signed)
Per Dr Garen LahMillers request, placed call to interpreter service and patient to discuss referral. Bengali interpreter translated the following message:  "Dr Hyacinth MeekerMiller requested a referral to Orthopedic for back pain. She received a note from Dr Aleen Campiysinger requesting you stay with him for your back pain. No referral will be made from Dr Rondel BatonMiller's office. Please contact Dr Star Ageysingers office to arrange care for back pain."  Patient understood. Patient requested lab results while on the phone. [facility]aced [facility] hold and [facility].   No further action required. Ok to close.

## 2015-12-19 NOTE — Telephone Encounter (Signed)
Telephone call passed from insurance department in office. Patient is on the line with an interpretor and would like to go over her results. Advised her affirm testing from 12/16/2015 which tests for yeast, BV, and trichomonas were all negative. Her pap smear and GC/Chl are pending. As soon as those results have returned we will be in contact with her to go over those results as well. Advised she may contact the office with any additional questions. Per interpretor patient verbalizes understanding.  Routing to provider for final review. Patient agreeable to disposition. Will close encounter.

## 2015-12-21 LAB — IPS PAP TEST WITH REFLEX TO HPV

## 2015-12-22 LAB — IPS N GONORRHOEA AND CHLAMYDIA BY PCR

## 2016-01-02 ENCOUNTER — Ambulatory Visit: Payer: BLUE CROSS/BLUE SHIELD | Admitting: Obstetrics & Gynecology

## 2016-05-28 ENCOUNTER — Ambulatory Visit: Payer: BLUE CROSS/BLUE SHIELD | Admitting: Obstetrics & Gynecology

## 2016-05-28 ENCOUNTER — Telehealth: Payer: Self-pay | Admitting: Obstetrics & Gynecology

## 2016-05-28 ENCOUNTER — Telehealth: Payer: Self-pay

## 2016-05-28 NOTE — Telephone Encounter (Signed)
Spoke with patient's husband Ranjit, okay per ROI. Advised appointment for today will need to be rescheduled as Dr.Miller is delayed in the OR. Husband is agreeable. Offered appointment for tomorrow and Thursday, but he declines. Appointment scheduled for 06/08/2016 at 10 am with Dr.Miller. He is agreeable to date and time and will notify the patient. States the patient is still deciding if she would like to terminate pregnancy. If she decides she would like to terminate pregnancy he will cancel her appointment for next week.  Routing to provider for final review. Patient agreeable to disposition. Will close encounter.

## 2016-05-28 NOTE — Telephone Encounter (Signed)
Spoke with patient's husband Ranjit, okay per ROI. Please see telephone note from earlier today. Husband states he will need to reschedule the patient's appointment due to a conflict. Would like to move appointment up to today. Appointment scheduled for today 05/28/2016 at 2:30 pm with Dr.Miller. He is agreeable to date and time. Will notify patient of appointment date and time.  Routing to provider for final review. Patient agreeable to disposition. Will close encounter.

## 2016-05-28 NOTE — Telephone Encounter (Signed)
Patient's husband, Chelsea Cook (DPR on file to share PHI), called requesting to speak with the nurse about his wife. She said his wife is pregnant but they have some concerns due to previous pregnancies. He did not say that she is having any problems at this time.

## 2016-05-28 NOTE — Telephone Encounter (Signed)
Spoke with patient's husband Ranjit, okay per ROI. Husband states that the patient took a pregnancy test on 8/26 which was positive. Reports the patient has concerns due to previous pregnancies. Patient is not currently having any problems. Husband states that the patient is unsure if she would like to keep the baby or not due to fears. Requesting an appointment to discuss concerns and plan to make next steps depending on discussion. Appointment scheduled for tomorrow 05/29/2016 at 2:45 pm with Dr.Miller. Husband is agreeable to date and time and will let the patient know. Will return call if appointment change is needed.  Routing to provider for final review. Patient agreeable to disposition. Will close encounter.

## 2016-05-29 ENCOUNTER — Ambulatory Visit: Payer: BLUE CROSS/BLUE SHIELD | Admitting: Obstetrics & Gynecology

## 2016-05-30 ENCOUNTER — Telehealth: Payer: Self-pay | Admitting: *Deleted

## 2016-05-30 NOTE — Telephone Encounter (Signed)
Call from patient's husband. He states patient has decided she does not want to keep pregnancy and wants to know what to do.  Has previously scheduled office visit with Dr Hyacinth MeekerMiller next Friday and declined earlier appointment to discuss options.  LMP 04-17-16.  [facility] [place].   He then asked what to do if she keeps the pregnancy and I discussed confirmation visit and we would help her set up obstetrical care.  Again offered earlier appointment here. Husband states they will keep scheduled appointment for next Friday for now and will call [facility] and then decide if they want appointment here.   Routing to provider for final review. Patient agreeable to disposition. Will close encounter.

## 2016-06-07 ENCOUNTER — Telehealth: Payer: Self-pay

## 2016-06-07 NOTE — Telephone Encounter (Signed)
Spoke with patients spouse, Chelsea RilyRanjit Hilgeman, okay per ROI. Called to confirm appointment for tomorrow with Dr. Hyacinth MeekerMiller at 10am for pregnancy confirmation. Per Chelsea Cook Beitzel, will keep appointment as scheduled, as patient has decided to keep pregnancy. Please see telephone encounter on 05/30/16.   Routing to provider for final review. Patient agreeable to disposition. Will close encounter.

## 2016-06-08 ENCOUNTER — Ambulatory Visit (INDEPENDENT_AMBULATORY_CARE_PROVIDER_SITE_OTHER): Payer: BLUE CROSS/BLUE SHIELD | Admitting: Obstetrics & Gynecology

## 2016-06-08 VITALS — BP 110/70 | HR 64 | Resp 14 | Ht 59.5 in | Wt 135.2 lb

## 2016-06-08 DIAGNOSIS — N912 Amenorrhea, unspecified: Secondary | ICD-10-CM | POA: Diagnosis not present

## 2016-06-08 LAB — POCT URINE PREGNANCY: PREG TEST UR: POSITIVE — AB

## 2016-06-16 ENCOUNTER — Encounter: Payer: Self-pay | Admitting: Obstetrics & Gynecology

## 2016-06-16 NOTE — Progress Notes (Signed)
GYNECOLOGY  VISIT   HPI: 35 y.o. [id number] Married [place] female with amenorrhea and positive home UPT.  Pt's LMP was 04/13/16.  She has reported to me several times in the past that she did not want any more pregnancies but she has declined contraception, even when I've spoken to her without her husband's presence.  She is now pregnancy again.  Her husband states "we are trying for a girl".  Pt having a lot of fatigue and breast tenderness as well as mild morning sickness.  Husband reports she just sleeps and rests most of the time and he "does all the work".  Expresses concern about this.  Normal fatigue in first trimester reviewed.  They did contemplate termination likely due to pt's lack of desire to carry another pregnancy but have decided against this.  She is not taking a PNV so this was recommended.  D/W pt and spouse possible ultrasound vs transfer of care at this point.  One big question they have si whether she can attempt VBAC.  She's had two cesarean sections.  [place] were [place] in [place].  Advised it is likely that they would need repeat cesarean section but should address with obstetrician at first visit so they can be aware of reasons that VBAC might be possible or when repeat cesarean section likely to occur.    No cats in the home. Tdap unknown so update will be recommended in pregnancy.  Aware flu vaccine safe and recommended.  She got her last one in February of this year, rather late.  Practices for transfer reviewed.  Information given.  Will help them make new ob appt before leave office.  Past Medical History:  Diagnosis Date  . Chronic back pain    since childbirth, x several years as of 07/2014  . Chronic leg pain    left along with hip and back pain since childbirth as of 07/2014    Past Surgical History:  Procedure Laterality Date  . CESAREAN SECTION     x2    MEDS:  Reviewed in EPIC and UTD  ALLERGIES: Review of patient's allergies indicates no known  allergies.  Family History  Problem Relation Age of Onset  . Diabetes Father    SH:  Married, non smoker  Review of Systems  Constitutional: Positive for malaise/fatigue.  All other systems reviewed and are negative.   PHYSICAL EXAMINATION:    BP 110/70 (BP Location: Right Arm, Patient Position: Sitting, Cuff Size: Normal)   Pulse 64   Resp 14   Ht 4' 11.5" (1.511 m)   Wt 135 lb 3.2 oz (61.3 kg)   LMP 04/13/2016   BMI 26.85 kg/m     No exam performed   Assessment: Newly pregnant in pt who considered termination early with this pregnancy but has decided to proceed with pregnancy H/O cesarean section x 2  Plan: Pt's husband desires transfer of care now with additional testing to be done, including viability PUS, after establishes care in ob office.  We will help couple do this before leaving office today.   ~15 minutes spent with patient >50% of time was in face to face discussion of above.

## 2016-06-19 ENCOUNTER — Telehealth: Payer: Self-pay | Admitting: Obstetrics & Gynecology

## 2016-06-19 NOTE — Telephone Encounter (Signed)
Left message for Baxter HireKristen with [facility] to call Kaitlyn at [phone].

## 2016-06-19 NOTE — Telephone Encounter (Signed)
Spoke with Baxter HireKristen at [facility]. Baxter HireKristen states the office is short staffed with ultrasound technicians and they are unable to get the patient in for her viability ultrasound for 3-4 weeks. Baxter HireKristen states Dr.Mody would like to know if we can perform her viability ultrasound at our office as the patient is now around [redacted] weeks pregnant, LMP 04/13/2016. Patient has her first appointment with Dr.Mody on 06/29/2016, but Baxter HireKristen states ultrasound is not able to be done at that appointment. Advised patient may have viability ultrasound performed with our office (reviewed with Billie RuddySally Yeakley, RN). Advised I will contact the patient's husband for scheduling. Results will need to [facility].  Spoke with patient's spouse, Ranjit, okay per ROI. Appointment for viability ultrasound scheduled for 06/21/2016 at 4 pm with 4:30 pm consult with Dr.Miller. Ranjit is agreeable to date and time. Order placed for precert.  Dr.Miller, okay to keep as scheduled?

## 2016-06-19 NOTE — Telephone Encounter (Signed)
Yes, this is fine. Thanks!

## 2016-06-19 NOTE — Telephone Encounter (Signed)
Kristen with [facility] calling to see if patient can have a confirmation ultrasound done here because they are short ultrasound techs. Her number is [phone] ext. [phone].

## 2016-06-21 ENCOUNTER — Encounter: Payer: Self-pay | Admitting: Obstetrics & Gynecology

## 2016-06-21 ENCOUNTER — Other Ambulatory Visit: Payer: Self-pay | Admitting: Obstetrics & Gynecology

## 2016-06-21 ENCOUNTER — Ambulatory Visit (INDEPENDENT_AMBULATORY_CARE_PROVIDER_SITE_OTHER): Payer: BLUE CROSS/BLUE SHIELD | Admitting: Obstetrics & Gynecology

## 2016-06-21 ENCOUNTER — Ambulatory Visit (INDEPENDENT_AMBULATORY_CARE_PROVIDER_SITE_OTHER): Payer: BLUE CROSS/BLUE SHIELD

## 2016-06-21 VITALS — BP 120/70 | HR 88 | Resp 16 | Ht 59.5 in | Wt 139.0 lb

## 2016-06-21 DIAGNOSIS — O3680X0 Pregnancy with inconclusive fetal viability, not applicable or unspecified: Secondary | ICD-10-CM

## 2016-06-21 DIAGNOSIS — O3680X1 Pregnancy with inconclusive fetal viability, fetus 1: Secondary | ICD-10-CM

## 2016-06-21 DIAGNOSIS — N912 Amenorrhea, unspecified: Secondary | ICD-10-CM | POA: Diagnosis not present

## 2016-06-21 NOTE — Addendum Note (Signed)
Addended by: Luisa DagoPHILLIPS, Rondrick Barreira C on: 06/21/2016 09:56 AM   Modules accepted: Orders

## 2016-06-21 NOTE — Progress Notes (Signed)
35 y.o. [id number] Married Caucasian female here for pelvic ultrasound for viability.  LMP 04/13/16.  Pt was [facility] for [facility].  Due to scheduling issues [facility], we were asked to do this here.   Translator used to communicate with pt.  She reports fatigue.  Denies pain or bleeding.    Based on LMP, EGA 9 6/7 weeks.  Patient's last menstrual period was 04/13/2016.  Findings:  UTERUS: Singleton IUP with DRL 3.2cm consistent with 10 1/7 weeks.  Normal appearing gestational sac, yolk sac present.  FHR 182bpm. ADNEXA: Left ovary: 3.6cm       Right ovary: 3.0cm CUL DE SAC: no free fluid  Discussion:  Findings reviewed with pt.  Questions answered.  Pt understands results will be sent to Dr. Juliene PinaMody and she will continue care at [facility].  Assessment:  Mason JimSingleton IUD, c/w dating by LMP  Plan:  [facility] care [facility].  Continue PNV.  ~15 minutes spent with patient >50% of time was in face to face discussion of above.

## 2016-06-23 ENCOUNTER — Encounter: Payer: Self-pay | Admitting: Obstetrics & Gynecology

## 2016-06-29 ENCOUNTER — Encounter (HOSPITAL_COMMUNITY): Payer: Self-pay | Admitting: *Deleted

## 2016-06-29 ENCOUNTER — Inpatient Hospital Stay (HOSPITAL_COMMUNITY)
Admission: AD | Admit: 2016-06-29 | Discharge: 2016-06-29 | Disposition: A | Discharge status: Home / Self Care | Payer: BLUE CROSS/BLUE SHIELD | Source: Ambulatory Visit | Attending: Obstetrics and Gynecology | Admitting: Obstetrics and Gynecology

## 2016-06-29 DIAGNOSIS — Z3A11 11 weeks gestation of pregnancy: Secondary | ICD-10-CM | POA: Insufficient documentation

## 2016-06-29 DIAGNOSIS — O99011 Anemia complicating pregnancy, first trimester: Secondary | ICD-10-CM | POA: Diagnosis not present

## 2016-06-29 DIAGNOSIS — D649 Anemia, unspecified: Secondary | ICD-10-CM | POA: Insufficient documentation

## 2016-06-29 DIAGNOSIS — O219 Vomiting of pregnancy, unspecified: Secondary | ICD-10-CM

## 2016-06-29 DIAGNOSIS — O210 Mild hyperemesis gravidarum: Secondary | ICD-10-CM | POA: Insufficient documentation

## 2016-06-29 DIAGNOSIS — O26891 Other specified pregnancy related conditions, first trimester: Secondary | ICD-10-CM

## 2016-06-29 DIAGNOSIS — R12 Heartburn: Secondary | ICD-10-CM | POA: Insufficient documentation

## 2016-06-29 LAB — URINALYSIS, ROUTINE W REFLEX MICROSCOPIC
BILIRUBIN URINE: NEGATIVE
Glucose, UA: NEGATIVE mg/dL
HGB URINE DIPSTICK: NEGATIVE
Ketones, ur: NEGATIVE mg/dL
Leukocytes, UA: NEGATIVE
NITRITE: NEGATIVE
PROTEIN: NEGATIVE mg/dL
SPECIFIC GRAVITY, URINE: 1.01 (ref 1.005–1.030)
pH: 6 (ref 5.0–8.0)

## 2016-06-29 LAB — CBC
HEMATOCRIT: 30.5 % — AB (ref 36.0–46.0)
Hemoglobin: 10 g/dL — ABNORMAL LOW (ref 12.0–15.0)
MCH: 25.7 pg — ABNORMAL LOW (ref 26.0–34.0)
MCHC: 32.8 g/dL (ref 30.0–36.0)
MCV: 78.4 fL (ref 78.0–100.0)
PLATELETS: 263 10*3/uL (ref 150–400)
RBC: 3.89 MIL/uL (ref 3.87–5.11)
RDW: 19.4 % — AB (ref 11.5–15.5)
WBC: 7.1 10*3/uL (ref 4.0–10.5)

## 2016-06-29 MED ORDER — PROMETHAZINE HCL 25 MG PO TABS
12.5000 mg | ORAL_TABLET | Freq: Once | ORAL | Status: AC
  Administered 2016-06-29: 12.5 mg via ORAL
  Filled 2016-06-29: qty 1

## 2016-06-29 MED ORDER — RANITIDINE HCL 150 MG PO TABS: 150.0000 mg | ORAL_TABLET | Freq: Two times a day (BID) | ORAL | 0 refills | Status: DC

## 2016-06-29 MED ORDER — FERROUS SULFATE 325 (65 FE) MG PO TABS: 325.0000 mg | ORAL_TABLET | Freq: Every day | ORAL | 0 refills | Status: DC

## 2016-06-29 MED ORDER — PROMETHAZINE HCL 12.5 MG PO TABS: 12.5000 mg | ORAL_TABLET | Freq: Four times a day (QID) | ORAL | 0 refills | Status: DC | PRN

## 2016-06-29 NOTE — MAU Provider Note (Signed)
History     CSN: [id number]  Arrival date and time: 06/29/16 [id number]   First Provider Initiated Contact with Patient 06/29/16 1012      Chief Complaint  Patient presents with  . Morning Sickness   HPI Chelsea Cook is a 35 y.o. [id number] at [id number] who presents for nausea and fatigue. Will be starting prenatal care with [facility] next month. Reports nausea during the pregnancy; worse in the mornings. Vomited once this morning. Does not have antiemetic at home. Denies abdominal pain, vaginal bleeding, diarrhea, or constipation. N/v associated with heartburn at times. Pt also reports feeling tired all the time.   OB History    Gravida Para Term Preterm AB Living   5 2     2 2    SAB TAB Ectopic Multiple Live Births   2              Past Medical History:  Diagnosis Date  . Chronic back pain    since childbirth, x several years as of 07/2014  . Chronic leg pain    left along with hip and back pain since childbirth as of 07/2014    Past Surgical History:  Procedure Laterality Date  . CESAREAN SECTION     x2    Family History  Problem Relation Age of Onset  . Diabetes Father     Social History  Substance Use Topics  . Smoking status: Never Smoker  . Smokeless tobacco: Never Used  . Alcohol use No    Allergies: No Known Allergies  Prescriptions Prior to Admission  Medication Sig Dispense Refill Last Dose  . acetaminophen (TYLENOL) 500 MG tablet Take 500 mg by mouth every 6 (six) hours as needed for moderate pain, fever or headache. Reported on 12/16/2015   Taking    Review of Systems  Constitutional: Positive for malaise/fatigue. Negative for chills, fever and weight loss.  Gastrointestinal: Positive for heartburn, nausea and vomiting. Negative for abdominal pain, constipation and diarrhea.  Genitourinary: Negative.    Physical Exam   Blood pressure 112/77, pulse 84, temperature 98 F (36.7 C), resp. rate 18, last menstrual period 04/13/2016, SpO2 99 %.  Physical Exam   Nursing note and vitals reviewed. Constitutional: She is oriented to person, place, and time. She appears well-developed and well-nourished. No distress.  HENT:  Head: Normocephalic and atraumatic.  Eyes: Conjunctivae are normal. Right eye exhibits no discharge. Left eye exhibits no discharge. No scleral icterus.  Neck: Normal range of motion.  Cardiovascular: Normal rate, regular rhythm and normal heart sounds.   No murmur heard. Respiratory: Effort normal and breath sounds normal. No respiratory distress. She has no wheezes.  GI: Soft. Bowel sounds are normal. There is no tenderness. There is no rebound and no guarding.  Neurological: She is alert and oriented to person, place, and time.  Skin: Skin is warm and dry. She is not diaphoretic.  Psychiatric: She has a normal mood and affect. Her behavior is normal. Judgment and thought content normal.    MAU Course  Procedures Results for orders placed or performed during the hospital encounter of 06/29/16 (from the past 24 hour(s))  Urinalysis, Routine w reflex microscopic (not at [facility])     Status: None   Collection Time: 06/29/16  9:50 AM  Result Value Ref Range   Color, Urine YELLOW YELLOW   APPearance CLEAR CLEAR   Specific Gravity, Urine 1.010 1.005 - 1.030   pH 6.0 5.0 - 8.0   Glucose, UA NEGATIVE NEGATIVE mg/dL  Hgb urine dipstick NEGATIVE NEGATIVE   Bilirubin Urine NEGATIVE NEGATIVE   Ketones, ur NEGATIVE NEGATIVE mg/dL   Protein, ur NEGATIVE NEGATIVE mg/dL   Nitrite NEGATIVE NEGATIVE   Leukocytes, UA NEGATIVE NEGATIVE  CBC     Status: Abnormal   Collection Time: 06/29/16 10:52 AM  Result Value Ref Range   WBC 7.1 4.0 - 10.5 K/uL   RBC 3.89 3.87 - 5.11 MIL/uL   Hemoglobin 10.0 (L) 12.0 - 15.0 g/dL   HCT 16.1 (L) 09.6 - 04.5 %   MCV 78.4 78.0 - 100.0 fL   MCH 25.7 (L) 26.0 - 34.0 pg   MCHC 32.8 30.0 - 36.0 g/dL   RDW 40.9 (H) 81.1 - 91.4 %   Platelets 263 150 - 400 K/uL    MDM FHT 172 by doppler CBC Phenergan  12.5 mg PO -- pt reports improvement in symptoms & able to tolerate crackers   Assessment and Plan  A: 1. Nausea and vomiting during pregnancy prior to [redacted] weeks gestation   2. Heartburn during pregnancy in first trimester   3. Anemia in pregnancy, first trimester     P; Discharge home Rx phenergan, ferrous sulfate, & zantac Start prenatal care as scheduled Discussed reasons to return to MAU  Judeth Horn 06/29/2016, 10:12 AM

## 2016-06-29 NOTE — MAU Note (Signed)
Pt presents to MAU with complaints of nausea. Pt had an appointment this morning [facility] by was not able to be seen [facility] and [facility]. Denies any vaginal bleeding or abnormal discharge

## 2016-06-29 NOTE — Discharge Instructions (Signed)
Morning Sickness Morning sickness is when you feel sick to your stomach (nauseous) during pregnancy. You may feel sick to your stomach and throw up (vomit). You may feel sick in the morning, but you can feel this way any time of day. Some women feel very sick to their stomach and cannot stop throwing up (hyperemesis gravidarum). HOME CARE  Only take medicines as told by your doctor.  Take multivitamins as told by your doctor. Taking multivitamins before getting pregnant can stop or lessen the harshness of morning sickness.  Eat dry toast or unsalted crackers before getting out of bed.  Eat 5 to 6 small meals a day.  Eat dry and bland foods like rice and baked potatoes.  Do not drink liquids with meals. Drink between meals.  Do not eat greasy, fatty, or spicy foods.  Have someone cook for you if the smell of food causes you to feel sick or throw up.  If you feel sick to your stomach after taking prenatal vitamins, take them at night or with a snack.  Eat protein when you need a snack (nuts, yogurt, cheese).  Eat unsweetened gelatins for dessert.  Wear a bracelet used for sea sickness (acupressure wristband).  Go to a doctor that puts thin needles into certain body points (acupuncture) to improve how you feel.  Do not smoke.  Use a humidifier to keep the air in your house free of odors.  Get lots of fresh air. GET HELP IF:  You need medicine to feel better.  You feel dizzy or lightheaded.  You are losing weight. GET HELP RIGHT AWAY IF:   You feel very sick to your stomach and cannot stop throwing up.  You pass out (faint). MAKE SURE YOU:  Understand these instructions.  Will watch your condition.  Will get help right away if you are not doing well or get worse.   This information is not intended to replace advice given to you by your health care provider. Make sure you discuss any questions you have with your health care provider.   Document Released: 10/25/2004  Document Revised: 10/08/2014 Document Reviewed: 03/04/2013 Elsevier Interactive Patient Education 2016 [employer]. Iron-Rich Diet Iron is a mineral that helps your body to produce hemoglobin. Hemoglobin is a protein in your red blood cells that carries oxygen to your body's tissues. Eating too little iron may cause you to feel weak and tired, and it can increase your risk for infection. Eating enough iron is necessary for your body's metabolism, muscle function, and nervous system. Iron is naturally found in many foods. It can also be added to foods or fortified in foods. There are two types of dietary iron:  Heme iron. Heme iron is absorbed by the body more easily than nonheme iron. Heme iron is found in meat, poultry, and fish.  Nonheme iron. Nonheme iron is found in dietary supplements, iron-fortified grains, beans, and vegetables. You may need to follow an iron-rich diet if:  You have been diagnosed with iron deficiency or iron-deficiency anemia.  You have a condition that prevents you from absorbing dietary iron, such as:  Infection in your intestines.  Celiac disease. This involves long-lasting (chronic) inflammation of your intestines.  You do not eat enough iron.  You eat a diet that is high in foods that impair iron absorption.  You have lost a lot of blood.  You have heavy bleeding during your menstrual cycle.  You are pregnant. WHAT IS MY PLAN? Your health care provider  may help you to determine how much iron you need per day based on your condition. Generally, when a person consumes sufficient amounts of iron in the diet, the following iron needs are met:  Men.  75-50 years old: 11 mg per day.  16-83 years old: 8 mg per day.  Women.   38-29 years old: 15 mg per day.  15-34 years old: 18 mg per day.  Over 37 years old: 8 mg per day.  Pregnant women: 27 mg per day.  Breastfeeding women: 9 mg per day. WHAT DO I NEED TO KNOW ABOUT AN IRON-RICH DIET?  Eat  fresh fruits and vegetables that are high in vitamin C along with foods that are high in iron. This will help increase the amount of iron that your body absorbs from food, especially with foods containing nonheme iron. Foods that are high in vitamin C include oranges, peppers, tomatoes, and mango.  Take iron supplements only as directed by your health care provider. Overdose of iron can be life-threatening. If you were prescribed iron supplements, take them with orange juice or a vitamin C supplement.  Cook foods in pots and pans that are made from iron.   Eat nonheme iron-containing foods alongside foods that are high in heme iron. This helps to improve your iron absorption.   Certain foods and drinks contain compounds that impair iron absorption. Avoid eating these foods in the same meal as iron-rich foods or with iron supplements. These include:  Coffee, black tea, and red wine.  Milk, dairy products, and foods that are high in calcium.  Beans, soybeans, and peas.  Whole grains.  When eating foods that contain both nonheme iron and compounds that impair iron absorption, follow these tips to absorb iron better.   Soak beans overnight before cooking.  Soak whole grains overnight and drain them before using.  Ferment flours before baking, such as using yeast in bread dough. WHAT FOODS CAN I EAT? Grains Iron-fortified breakfast cereal. Iron-fortified whole-wheat bread. Enriched rice. Sprouted grains. Vegetables Spinach. Potatoes with skin. Green peas. Broccoli. Red and green bell peppers. Fermented vegetables. Fruits Prunes. Raisins. Oranges. Strawberries. Mango. Grapefruit. Meats and Other Protein Sources Beef liver. Oysters. Beef. Shrimp. [place]. Chicken. [place]. Sardines. Chickpeas. Nuts. Tofu. Beverages Tomato juice. Fresh orange juice. Prune juice. Hibiscus tea. Fortified instant breakfast shakes. Condiments Tahini. Fermented soy sauce. Sweets and  Desserts Black-strap molasses.  Other Wheat germ. The items listed above may not be a complete list of recommended foods or beverages. Contact your dietitian for more options. WHAT FOODS ARE NOT RECOMMENDED? Grains Whole grains. Bran cereal. Bran flour. Oats. Vegetables Artichokes. Brussels sprouts. Kale. Fruits Blueberries. Raspberries. Strawberries. Figs. Meats and Other Protein Sources Soybeans. Products made from soy protein. Dairy Milk. Cream. Cheese. Yogurt. Cottage cheese. Beverages Coffee. Black tea. Red wine. Sweets and Desserts Cocoa. Chocolate. Ice cream. Other Basil. Oregano. Parsley. The items listed above may not be a complete list of foods and beverages to avoid. Contact your dietitian for more information.   This information is not intended to replace advice given to you by your health care provider. Make sure you discuss any questions you have with your health care provider.   Document Released: 05/01/2005 Document Revised: 10/08/2014 Document Reviewed: 04/14/2014 Elsevier Interactive Patient Education [employer].

## 2016-07-19 ENCOUNTER — Ambulatory Visit (INDEPENDENT_AMBULATORY_CARE_PROVIDER_SITE_OTHER): Payer: Medicaid Other | Admitting: Obstetrics & Gynecology

## 2016-07-19 ENCOUNTER — Encounter: Payer: Self-pay | Admitting: Obstetrics & Gynecology

## 2016-07-19 VITALS — BP 111/82 | HR 85 | Temp 97.8°F | Wt 143.0 lb

## 2016-07-19 DIAGNOSIS — O09522 Supervision of elderly multigravida, second trimester: Secondary | ICD-10-CM

## 2016-07-19 DIAGNOSIS — Z3482 Encounter for supervision of other normal pregnancy, second trimester: Secondary | ICD-10-CM

## 2016-07-19 DIAGNOSIS — O34219 Maternal care for unspecified type scar from previous cesarean delivery: Secondary | ICD-10-CM | POA: Diagnosis not present

## 2016-07-19 DIAGNOSIS — Z3689 Encounter for other specified antenatal screening: Secondary | ICD-10-CM | POA: Diagnosis not present

## 2016-07-19 DIAGNOSIS — O09529 Supervision of elderly multigravida, unspecified trimester: Secondary | ICD-10-CM | POA: Insufficient documentation

## 2016-07-19 DIAGNOSIS — Z3490 Encounter for supervision of normal pregnancy, unspecified, unspecified trimester: Secondary | ICD-10-CM | POA: Insufficient documentation

## 2016-07-19 NOTE — Progress Notes (Signed)
Subjective:    Chelsea Cook is a 35 y.o. [id number] at [redacted]w[redacted]d by LMP c/w 10 week scan being seen today for her first obstetrical visit.  Her obstetrical history is significant for previous cesarean section x 2. Patient does intend to breast feed. Pregnancy history fully reviewed. Accompanied by husband.  Patient reports occasional backache and headache, does not take anything.  Vitals:   07/19/16 1522  BP: 111/82  Pulse: 85  Temp: 97.8 F (36.6 C)  Weight: 143 lb (64.9 kg)    HISTORY: OB History  Gravida Para Term Preterm AB Living  5 2 1   2 2   SAB TAB Ectopic Multiple Live Births  2       2    # Outcome Date GA Lbr Len/2nd Weight Sex Delivery Anes PTL Lv  5 Current           4 SAB           3 SAB           2 Term      CS-LTranv   LIV  1 Para      CS-LTranv   LIV     Past Medical History:  Diagnosis Date  . Chronic back pain    since childbirth, x several years as of 07/2014  . Chronic leg pain    left along with hip and back pain since childbirth as of 07/2014  . Low back pain 11/15/2015  . Pelvic pain in female 11/15/2015  . Upper back pain 11/15/2015   Past Surgical History:  Procedure Laterality Date  . CESAREAN SECTION     x2   Family History  Problem Relation Age of Onset  . Diabetes Father      Exam    Uterus:     Pelvic Exam: Deferred. Normal pelvic exam and pap smear in 11/2015.  System: Breast:  normal appearance, no masses or tenderness   Skin: normal coloration and turgor, no rashes   Neurologic: normal, negative   Extremities: normal strength, tone, and muscle mass   HEENT PERRLA, extra ocular movement intact and sclera clear, anicteric   Mouth/Teeth mucous membranes moist, pharynx normal without lesions and dental hygiene good   Neck supple and no masses   Cardiovascular: regular rate and rhythm   Respiratory:  appears well, vitals normal, no respiratory distress, acyanotic, normal RR, chest clear, no wheezing, crepitations, rhonchi, normal  symmetric air entry   Abdomen: soft, non-tender; bowel sounds normal; no masses,  no organomegaly    Assessment:    Pregnancy: [id number] Patient Active Problem List   Diagnosis Date Noted  . Previous cesarean delivery x 2, antepartum 07/19/2016  . Advanced maternal age in multigravida 07/19/2016  . Supervision of normal pregnancy 07/19/2016      Plan:   1. Previous cesarean delivery x 2, antepartum Considering TOLAC, counseled about risks of uterine rupture. Information given to her to review at home. Will make decision by next visit.  2. Advanced maternal age in multigravida, second trimester 3. Encounter for fetal anatomic survey Detailed anatomy scan ordered at MFM, also genetic counseling - [facility]B MFM GENETICS REFERRAL - AMB referral to maternal fetal medicine - [place] [facility] OB DETAIL +14 WK; Future  4. Encounter for supervision of other normal pregnancy in second trimester Initial labs drawn. - Prenatal Profile I - HIV antibody - Hemoglobinopathy evaluation - Culture, OB Urine - ToxASSURE Select 13 (MW), Urine - GC/Chlamydia probe amp ([place])not at [facility] Continue prenatal  vitamins. Tylenol recommended for back ache and headaches. Problem list reviewed and updated. The nature of [facility] with multiple MDs and other Advanced Practice Providers was explained to patient; also emphasized that residents, students are part of our team. Routine obstetric precautions reviewed.  Follow up in 4 weeks.   Tereso NewcomerANYANWU,[name], MD 07/19/2016

## 2016-07-19 NOTE — Patient Instructions (Addendum)
Thank you for enrolling in MyChart. Please follow the instructions below to securely access your online medical record. MyChart allows you to send messages to your doctor, view your test results, manage appointments, and more.   How Do I Sign Up? 1. In your Internet browser, go to [employer] and enter [email or web address]. 2. Click on the Sign Up Now link in the Sign In box. You will see the New Member Sign Up page. 3. Enter your MyChart Access Code exactly as it appears below. You will not need to use this code after you've completed the sign-up process. If you do not sign up before the expiration date, you must request a new code.  MyChart Access Code: [email or web address] Expires: 08/20/2016  4:50 PM  4. Enter your Social Security Number ([id number]) and Date of Birth (mm/dd/yyyy) as indicated and click Submit. You will be taken to the next sign-up page. 5. Create a MyChart ID. This will be your MyChart login ID and cannot be changed, so think of one that is secure and easy to remember. 6. Create a MyChart password. You can change your password at any time. 7. Enter your Password Reset Question and Answer. This can be used at a later time if you forget your password.  8. Enter your e-mail address. You will receive e-mail notification when new information is available in MyChart. 9. Click Sign Up. You can now view your medical record.   Additional Information Remember, MyChart is NOT to be used for urgent needs. For medical emergencies, dial 911.     Trial of Labor After Cesarean Delivery Information A trial of labor after cesarean delivery (TOLAC) is when a woman tries to give birth vaginally after a previous cesarean delivery. TOLAC may be a safe and appropriate option for you depending on your medical history and other risk factors. When TOLAC is successful and you are able to have a vaginal delivery, this is called a vaginal birth after cesarean delivery (VBAC).   CANDIDATES FOR TOLAC TOLAC is possible for some women who:  Have undergone one or two prior cesarean deliveries in which the incision of the uterus was horizontal (low transverse).  Are carrying twins and have had one prior low transverse incision during a cesarean delivery.  Do not have a vertical (classical) uterine scar.  Have not had a tear in the wall of their uterus (uterine rupture). TOLAC is also supported for women who meet appropriate criteria and:  Are under the age of 40 years.  Are tall and have a body mass index (BMI) of less than 30.  Have an unknown uterine scar.  Give birth in a facility equipped to handle an emergency cesarean delivery. This team should be able to handle possible complications such as a uterine rupture.  Have thorough counseling about the benefits and risks of TOLAC.  Have discussed future pregnancy plans with their health care provider.  Plan to have several more pregnancies. MOST SUCCESSFUL CANDIDATES FOR TOLAC:  Have had a successful vaginal delivery before or after their cesarean delivery.  Experience labor that begins naturally on or before the due date (40 weeks of gestation).  Do not have a very large (macrosomic) baby.   Had a prior cesarean delivery but are not currently experiencing factors that would prompt a cesarean delivery (such as a breech position).  Had only one prior cesarean delivery.  Had a prior cesarean delivery that was performed early in labor and not after full cervical dilation. TOLAC may be  most appropriate for women who meet the above guidelines and who plan to have more pregnancies. TOLAC is not recommended for home births. LEAST SUCCESSFUL CANDIDATES FOR TOLAC:  Have an induced labor with an unfavorable cervix. An unfavorable cervix is when the cervix is not dilating enough (among other factors).  Have never had a vaginal delivery.  Have had more than two cesarean deliveries.  Have a pregnancy at more  than 40 weeks of gestation.  Are pregnant with a baby with a suspected weight greater than 4,000 grams (8 pounds) and who have no prior history of a vaginal delivery.  Have closely spaced pregnancies. SUGGESTED BENEFITS OF TOLAC  You may have a faster recovery time.  You may have a shorter stay in the hospital.  You may have less pain and fewer problems than with a cesarean delivery. Women who have a cesarean delivery have a higher chance of needing blood or getting a fever, an infection, or a blood clot in the legs. SUGGESTED RISKS OF TOLAC The highest risk of complications happens to women who attempt a TOLAC and fail. A failed TOLAC results in an unplanned cesarean delivery. Risks related to [facility] or repeat cesarean deliveries include:   Blood loss.  Infection.  Blood clot.  Injury to surrounding tissues or organs.  Having to remove the uterus (hysterectomy).  Potential problems with the placenta (such as placenta previa or placenta accreta) in future pregnancies. Although very rare, the main concerns with TOLAC are:  Rupture of the uterine scar from a past cesarean delivery.  Needing an emergency cesarean delivery.  Having a bad outcome for the baby (perinatal morbidity). FOR MORE INFORMATION American Congress of Obstetricians and Gynecologists: www.acog.org [employer] Nurse-Midwives: www.midwife.org    Vaginal Birth After Cesarean Delivery Vaginal birth after cesarean delivery (VBAC) is giving birth vaginally after previously delivering a baby by a cesarean. In the past, if a woman had a cesarean delivery, all births afterward would be done by cesarean delivery. This is no longer true. It can be safe for the mother to try a vaginal delivery after having a cesarean delivery.  It is important to discuss VBAC with your health care provider early in the pregnancy so you can understand the risks, benefits, and options. It will give you time to decide what is best in  your particular case. The final decision about whether to have a VBAC or repeat cesarean delivery should be between you and your health care provider. Any changes in your health or your baby's health during your pregnancy may make it necessary to change your initial decision about VBAC.  WOMEN WHO PLAN TO HAVE A VBAC SHOULD CHECK WITH THEIR HEALTH CARE PROVIDER TO BE SURE THAT:  The previous cesarean delivery was done with a low transverse uterine cut (incision) (not a vertical classical incision).   The birth canal is big enough for the baby.   There were no other operations on the uterus.   An electronic fetal monitor (EFM) will be on at all times during labor.   An operating room will be available and ready in case an emergency cesarean delivery is needed.   A health care provider and surgical nursing staff will be available at all times during labor to be ready to do an emergency delivery cesarean if necessary.   An anesthesiologist will be present in case an emergency cesarean delivery is needed.   The nursery is prepared and has adequate personnel and necessary equipment available to  care for the baby in case of an emergency cesarean delivery. BENEFITS OF VBAC  Shorter stay in the hospital.   Avoidance of risks associated with cesarean delivery, such as:  Surgical complications, such as opening of the incision or hernia in the incision.  Injury to other organs.  Fever. This can occur if an infection develops after surgery. It can also occur as a reaction to the medicine given to make you numb during the surgery.  Less blood loss and need for blood transfusions.  Lower risk of blood clots and infection.  Shorter recovery.   Decreased risk for having to remove the uterus (hysterectomy).   Decreased risk for the placenta to completely or partially cover the opening of the uterus (placenta previa) with a future pregnancy.   Decrease risk in future labor and  delivery. RISKS OF A VBAC  Tearing (rupture) of the uterus. This is occurs in less than 1% of VBACs. The risk of this happening is higher if:  Steps are taken to begin the labor process (induce labor) or stimulate or strengthen contractions (augment labor).   Medicine is used to soften (ripen) the cervix.  Having to remove the uterus (hysterectomy) if it ruptures. VBAC SHOULD NOT BE DONE IF:  The previous cesarean delivery was done with a vertical (classical) or T-shaped incision or you do not know what kind of incision was made.   You had a ruptured uterus.   You have had certain types of surgery on your uterus, such as removal of uterine fibroids. Ask your health care provider about other types of surgeries that prevent you from having a VBAC.  You have certain medical or childbirth (obstetrical) problems.   There are problems with the baby.   You have had two previous cesarean deliveries and no vaginal deliveries. OTHER FACTS TO KNOW ABOUT VBAC:  It is safe to have an epidural anesthetic with VBAC.   It is safe to turn the baby from a breech position (attempt an external cephalic version).   It is safe to try a VBAC with twins.   VBAC may not be successful if your baby weights 8.8 lb (4 kg) or more. However, weight predictions are not always accurate and should not be used alone to decide if VBAC is right for you.  There is an increased failure rate if the time between the cesarean delivery and VBAC is less than 19 months.   Your health care provider may advise against a VBAC if you have preeclampsia (high blood pressure, protein in the urine, and swelling of face and extremities).   VBAC is often successful if you previously gave birth vaginally.   VBAC is often successful when the labor starts spontaneously before the due date.   Delivering a baby through a VBAC is similar to having a normal spontaneous vaginal delivery.   This information is not intended  to replace advice given to you by your health care provider. Make sure you discuss any questions you have with your health care provider.   Document Released: 03/10/2007 Document Revised: 10/08/2014 Document Reviewed: 04/16/2013 Elsevier Interactive Patient Education [employer] .    Second Trimester of Pregnancy The second trimester is from week 13 through week 28, months 4 through 6. The second trimester is often a time when you feel your best. Your body has also adjusted to being pregnant, and you begin to feel better physically. Usually, morning sickness has lessened or quit completely, you may have more energy,  and you may have an increase in appetite. The second trimester is also a time when the fetus is growing rapidly. At the end of the sixth month, the fetus is about 9 inches long and weighs about 1 pounds. You will likely begin to feel the baby move (quickening) between 18 and 20 weeks of the pregnancy. BODY CHANGES Your body goes through many changes during pregnancy. The changes vary from woman to woman.   Your weight will continue to increase. You will notice your lower abdomen bulging out.  You may begin to get stretch marks on your hips, abdomen, and breasts.  You may develop headaches that can be relieved by medicines approved by your health care provider.  You may urinate more often because the fetus is pressing on your bladder.  You may develop or continue to have heartburn as a result of your pregnancy.  You may develop constipation because certain hormones are causing the muscles that push waste through your intestines to slow down.  You may develop hemorrhoids or swollen, bulging veins (varicose veins).  You may have back pain because of the weight gain and pregnancy hormones relaxing your joints between the bones in your pelvis and as a result of a shift in weight and the muscles that support your balance.  Your breasts will continue to grow and be  tender.  Your gums may bleed and may be sensitive to brushing and flossing.  Dark spots or blotches (chloasma, mask of pregnancy) may develop on your face. This will likely fade after the baby is born.  A dark line from your belly button to the pubic area (linea nigra) may appear. This will likely fade after the baby is born.  You may have changes in your hair. These can include thickening of your hair, rapid growth, and changes in texture. Some women also have hair loss during or after pregnancy, or hair that feels dry or thin. Your hair will most likely return to normal after your baby is born. WHAT TO EXPECT AT YOUR PRENATAL VISITS During a routine prenatal visit:  You will be weighed to make sure you and the fetus are growing normally.  Your blood pressure will be taken.  Your abdomen will be measured to track your baby's growth.  The fetal heartbeat will be listened to.  Any test results from the previous visit will be discussed. Your health care provider may ask you:  How you are feeling.  If you are feeling the baby move.  If you have had any abnormal symptoms, such as leaking fluid, bleeding, severe headaches, or abdominal cramping.  If you are using any tobacco products, including cigarettes, chewing tobacco, and electronic cigarettes.  If you have any questions. Other tests that may be performed during your second trimester include:  Blood tests that check for:  Low iron levels (anemia).  Gestational diabetes (between 24 and 28 weeks).  Rh antibodies.  Urine tests to check for infections, diabetes, or protein in the urine.  An ultrasound to confirm the proper growth and development of the baby.  An amniocentesis to check for possible genetic problems.  Fetal screens for spina bifida and Down syndrome.  HIV (human immunodeficiency virus) testing. Routine prenatal testing includes screening for HIV, unless you choose not to have this test. HOME CARE  INSTRUCTIONS   Avoid all smoking, herbs, alcohol, and unprescribed drugs. These chemicals affect the formation and growth of the baby.  Do not use any tobacco products, including cigarettes, chewing  tobacco, and electronic cigarettes. If you need help quitting, ask your health care provider. You may receive counseling support and other resources to help you quit.  Follow your health care provider's instructions regarding medicine use. There are medicines that are either safe or unsafe to take during pregnancy.  Exercise only as directed by your health care provider. Experiencing uterine cramps is a good sign to stop exercising.  Continue to eat regular, healthy meals.  Wear a good support bra for breast tenderness.  Do not use hot tubs, steam rooms, or saunas.  Wear your seat belt at all times when driving.  Avoid raw meat, uncooked cheese, cat litter boxes, and soil used by cats. These carry germs that can cause birth defects in the baby.  Take your prenatal vitamins.  Take 1500-2000 mg of calcium daily starting at the 20th week of pregnancy until you deliver your baby.  Try taking a stool softener (if your health care provider approves) if you develop constipation. Eat more high-fiber foods, such as fresh vegetables or fruit and whole grains. Drink plenty of fluids to keep your urine clear or pale yellow.  Take warm sitz baths to soothe any pain or discomfort caused by hemorrhoids. Use hemorrhoid cream if your health care provider approves.  If you develop varicose veins, wear support hose. Elevate your feet for 15 minutes, 3-4 times a day. Limit salt in your diet.  Avoid heavy lifting, wear low heel shoes, and practice good posture.  Rest with your legs elevated if you have leg cramps or low back pain.  Visit your dentist if you have not gone yet during your pregnancy. Use a soft toothbrush to brush your teeth and be gentle when you floss.  A sexual relationship may be  continued unless your health care provider directs you otherwise.  Continue to go to all your prenatal visits as directed by your health care provider. SEEK MEDICAL CARE IF:   You have dizziness.  You have mild pelvic cramps, pelvic pressure, or nagging pain in the abdominal area.  You have persistent nausea, vomiting, or diarrhea.  You have a bad smelling vaginal discharge.  You have pain with urination. SEEK IMMEDIATE MEDICAL CARE IF:   You have a fever.  You are leaking fluid from your vagina.  You have spotting or bleeding from your vagina.  You have severe abdominal cramping or pain.  You have rapid weight gain or loss.  You have shortness of breath with chest pain.  You notice sudden or extreme swelling of your face, hands, ankles, feet, or legs.  You have not felt your baby move in over an hour.  You have severe headaches that do not go away with medicine.  You have vision changes.   This information is not intended to replace advice given to you by your health care provider. Make sure you discuss any questions you have with your health care provider.   Document Released: 09/11/2001 Document Revised: 10/08/2014 Document Reviewed: 11/18/2012 Elsevier Interactive Patient Education [employer] Elsevier Inc.

## 2016-07-21 LAB — CULTURE, OB URINE

## 2016-07-21 LAB — URINE CULTURE, OB REFLEX

## 2016-07-27 LAB — PRENATAL PROFILE I(LABCORP)
ANTIBODY SCREEN: NEGATIVE
BASOS: 0 %
Basophils Absolute: 0 10*3/uL (ref 0.0–0.2)
EOS (ABSOLUTE): 0.2 10*3/uL (ref 0.0–0.4)
Eos: 3 %
HEMATOCRIT: 35.7 % (ref 34.0–46.6)
HEP B S AG: NEGATIVE
Hemoglobin: 11.2 g/dL (ref 11.1–15.9)
Immature Grans (Abs): 0.1 10*3/uL (ref 0.0–0.1)
Immature Granulocytes: 1 %
LYMPHS ABS: 1.6 10*3/uL (ref 0.7–3.1)
Lymphs: 22 %
MCH: 27.1 pg (ref 26.6–33.0)
MCHC: 31.4 g/dL — AB (ref 31.5–35.7)
MCV: 86 fL (ref 79–97)
MONOS ABS: 0.5 10*3/uL (ref 0.1–0.9)
Monocytes: 7 %
NEUTROS ABS: 4.7 10*3/uL (ref 1.4–7.0)
Neutrophils: 67 %
Platelets: 271 10*3/uL (ref 150–379)
RBC: 4.14 x10E6/uL (ref 3.77–5.28)
RDW: 22.4 % — ABNORMAL HIGH (ref 12.3–15.4)
RH TYPE: POSITIVE
RPR: NONREACTIVE
RUBELLA: 4.11 {index} (ref >0.99)
WBC: 7 10*3/uL (ref 3.4–10.8)

## 2016-07-27 LAB — TOXASSURE SELECT 13 (MW), URINE

## 2016-07-27 LAB — HEMOGLOBINOPATHY EVALUATION
HEMOGLOBIN A2 QUANTITATION: 2.3 % (ref 0.7–3.1)
HGB A: 97.7 % (ref 94.0–98.0)
HGB C: 0 %
HGB S: 0 %
Hemoglobin F Quantitation: 0 % (ref 0.0–2.0)

## 2016-07-27 LAB — HIV ANTIBODY (ROUTINE TESTING W REFLEX): HIV Screen 4th Generation wRfx: NONREACTIVE

## 2016-08-02 ENCOUNTER — Other Ambulatory Visit: Payer: Self-pay | Admitting: *Deleted

## 2016-08-02 ENCOUNTER — Encounter (HOSPITAL_COMMUNITY): Payer: Self-pay | Admitting: Obstetrics & Gynecology

## 2016-08-02 DIAGNOSIS — Z3480 Encounter for supervision of other normal pregnancy, unspecified trimester: Secondary | ICD-10-CM

## 2016-08-02 MED ORDER — CITRANATAL HARMONY 27-1-260 MG PO CAPS: 1.0000 | ORAL_CAPSULE | Freq: Every day | ORAL | 11 refills | Status: DC

## 2016-08-16 ENCOUNTER — Ambulatory Visit (INDEPENDENT_AMBULATORY_CARE_PROVIDER_SITE_OTHER): Payer: Medicaid Other | Admitting: Obstetrics & Gynecology

## 2016-08-16 ENCOUNTER — Encounter: Payer: Self-pay | Admitting: Obstetrics & Gynecology

## 2016-08-16 DIAGNOSIS — Z3482 Encounter for supervision of other normal pregnancy, second trimester: Secondary | ICD-10-CM

## 2016-08-16 DIAGNOSIS — Z789 Other specified health status: Secondary | ICD-10-CM

## 2016-08-16 DIAGNOSIS — Z3480 Encounter for supervision of other normal pregnancy, unspecified trimester: Secondary | ICD-10-CM

## 2016-08-16 MED ORDER — RANITIDINE HCL 150 MG PO TABS: 150.0000 mg | ORAL_TABLET | Freq: Two times a day (BID) | ORAL | 6 refills | Status: DC

## 2016-08-16 NOTE — Progress Notes (Signed)
Patient reports good fetal movement but states that every time she does too much walking she has back pain that she believes to be contractions.

## 2016-08-16 NOTE — Progress Notes (Signed)
   PRENATAL VISIT NOTE  Subjective:  Chelsea Cook is a 35 y.o. Chelsea Cook at 642w6d being seen today for ongoing prenatal care.  She is currently monitored for the following issues for this low-risk pregnancy and has Previous cesarean delivery x 2, antepartum; Advanced maternal age in multigravida; Supervision of normal pregnancy; and Language barrier on her problem list.  Patient reports backache and headache.  Contractions: Irritability. Vag. Bleeding: None.  Movement: Present. Denies leaking of fluid.   The following portions of the patient's history were reviewed and updated as appropriate: allergies, current medications, past family history, past medical history, past social history, past surgical history and problem list. Problem list updated.  Objective:   Vitals:   08/16/16 0938  BP: 115/71  Pulse: (!) 102  Temp: 98.3 F (36.8 C)  Weight: 146 lb 3.2 oz (66.3 kg)    Fetal Status: Fetal Heart Rate (bpm): 152   Movement: Present     General:  Alert, oriented and cooperative. Patient is in no acute distress.  Skin: Skin is warm and dry. No rash noted.   Cardiovascular: Normal heart rate noted  Respiratory: Normal respiratory effort, no problems with respiration noted  Abdomen: Soft, gravid, appropriate for gestational age. Pain/Pressure: Absent     Pelvic:  Cervical exam deferred        Extremities: Normal range of motion.  Edema: None  Mental Status: Normal mood and affect. Normal behavior. Normal judgment and thought content.  Back not tender Assessment and Plan:  Pregnancy: [id number] at 302w6d  1. Supervision of other normal pregnancy, antepartum US tomorrow  2. Language barrier Bengali language with  Limited English  Preterm labor symptoms and general obstetric precautions including but not limited to vaginal bleeding, contractions, leaking of fluid and fetal movement were reviewed in detail with the patient. Please refer to After Visit Summary for other counseling  recommendations.  Return in about 2 weeks (around 08/30/2016). Discuss TOLAC and genetic testing with interpreter present [place]  Chelsea PhenixJames G Stepfanie Yott, MD

## 2016-08-17 ENCOUNTER — Other Ambulatory Visit: Payer: Self-pay | Admitting: Obstetrics & Gynecology

## 2016-08-17 ENCOUNTER — Encounter (HOSPITAL_COMMUNITY): Payer: Self-pay

## 2016-08-17 ENCOUNTER — Ambulatory Visit (HOSPITAL_COMMUNITY)
Admission: RE | Admit: 2016-08-17 | Discharge: 2016-08-17 | Disposition: A | Discharge status: Home / Self Care | Payer: Medicaid Other | Source: Ambulatory Visit | Attending: Obstetrics & Gynecology | Admitting: Obstetrics & Gynecology

## 2016-08-17 DIAGNOSIS — O09529 Supervision of elderly multigravida, unspecified trimester: Secondary | ICD-10-CM

## 2016-08-17 DIAGNOSIS — Z3689 Encounter for other specified antenatal screening: Secondary | ICD-10-CM

## 2016-08-17 DIAGNOSIS — Z3A18 18 weeks gestation of pregnancy: Secondary | ICD-10-CM

## 2016-08-17 DIAGNOSIS — O09522 Supervision of elderly multigravida, second trimester: Secondary | ICD-10-CM

## 2016-08-17 NOTE — Progress Notes (Signed)
Genetic Counseling  High-Risk Gestation Note  Appointment Date:  08/17/2016 Referred By: Tereso NewcomerAnyanwu, Ugonna A, MD Date of Birth:  02/03/1981   Pregnancy History: [id number] Estimated Date of Delivery: 01/18/17 Estimated Gestational Age: 7431w0d Attending: Alpha GulaPaul Whitecar, MD   Ms. Chelsea Cook was seen for genetic counseling because of Cook maternal age of 35 y.o..  She declined the use of medical interpreter during today's visit.   In summary:  Discussed AMA and associated risk for fetal aneuploidy  Discussed options for screening  Quad screen-declined  NIPS-declined  Ultrasound-performed today, within normal limits  Discussed diagnostic testing options  Amniocentesis-declined  Reviewed family history concerns  She was counseled regarding maternal age and the association with risk for chromosome conditions due to nondisjunction with aging of the ova.   We reviewed chromosomes, nondisjunction, and the associated 1 in 141 risk for fetal aneuploidy related to Cook maternal age of 35 y.o. at 7431w0d gestation.  She was counseled that the risk for aneuploidy decreases as gestational age increases, accounting for those pregnancies which spontaneously abort.  We specifically discussed Down syndrome (trisomy 7321), trisomies 3213 and 4518, and sex chromosome aneuploidies (47,XXX and 47,XXY) including the common features and prognoses of each.   We reviewed available screening options including Quad screen, noninvasive prenatal screening (NIPS)/cell free DNA (cfDNA) screening, and detailed ultrasound.  She was counseled that screening tests are used to modify Cook patient's Cook priori risk for aneuploidy, typically based on age. This estimate provides Cook pregnancy specific risk assessment. We reviewed the benefits and limitations of each option. Specifically, we discussed the conditions for which each test screens, the detection rates, and false positive rates of each. She was also counseled regarding diagnostic testing via  amniocentesis. We reviewed the approximate 1 in 300-500 risk for complications from amniocentesis, including spontaneous pregnancy loss. We discussed the possible results that the tests might provide including: positive, negative, unanticipated, and no result. Finally, they were counseled regarding the cost of each option and potential out of pocket expenses.  After consideration of all the options, she declined any prenatal bloodwork screening or diagnostic testing for chromosome conditions. She stated that she desires ultrasound screening only.   Cook complete ultrasound was performed today. The ultrasound report will be sent under separate cover. There were no visualized fetal anomalies or markers suggestive of aneuploidy. Diagnostic testing was declined today.  She understands that screening tests cannot rule out all birth defects or genetic syndromes. The patient was advised of this limitation and states she still does not want additional testing at this time.   Chelsea Cook was provided with written information regarding cystic fibrosis (CF), spinal muscular atrophy (SMA) and hemoglobinopathies including the carrier frequency, availability of carrier screening and prenatal diagnosis if indicated.  In addition, we discussed that CF and hemoglobinopathies are routinely screened for as part of the [place] newborn screening panel.  After further discussion, she declined screening for CF, SMA and hemoglobinopathies.  Both family histories were reviewed and found to be noncontributory for birth defects, intellectual disability, and known genetic conditions. Without further information regarding the provided family history, an accurate genetic risk cannot be calculated. Further genetic counseling is warranted if more information is obtained.  Chelsea Cook denied exposure to environmental toxins or chemical agents. She denied the use of alcohol, tobacco or street drugs. She denied significant viral illnesses during  the course of her pregnancy. Her medical and surgical histories were noncontributory.   I counseled Chelsea Cook regarding  the above risks and available options.  The approximate face-to-face time with the genetic counselor was 35 minutes.  Quinn PlowmanKaren Sharnay Cashion, MS,  Certified Genetic Counselor 08/17/2016

## 2016-08-30 ENCOUNTER — Encounter: Payer: Medicaid Other | Admitting: Obstetrics & Gynecology

## 2016-12-03 ENCOUNTER — Inpatient Hospital Stay (HOSPITAL_COMMUNITY): Payer: Medicaid Other | Admitting: Anesthesiology

## 2016-12-03 ENCOUNTER — Encounter (HOSPITAL_COMMUNITY)
Admission: AD | Disposition: A | Discharge status: Home / Self Care | Payer: Self-pay | Source: Ambulatory Visit | Attending: Obstetrics and Gynecology

## 2016-12-03 ENCOUNTER — Inpatient Hospital Stay (HOSPITAL_COMMUNITY)
Admission: AD | Admit: 2016-12-03 | Discharge: 2016-12-06 | DRG: 765 | Disposition: A | Discharge status: Home / Self Care | Payer: Medicaid Other | Source: Ambulatory Visit | Attending: Obstetrics and Gynecology | Admitting: Obstetrics and Gynecology

## 2016-12-03 ENCOUNTER — Encounter (HOSPITAL_COMMUNITY): Payer: Self-pay | Admitting: *Deleted

## 2016-12-03 DIAGNOSIS — D649 Anemia, unspecified: Secondary | ICD-10-CM | POA: Diagnosis present

## 2016-12-03 DIAGNOSIS — O9902 Anemia complicating childbirth: Secondary | ICD-10-CM | POA: Diagnosis present

## 2016-12-03 DIAGNOSIS — O42013 Preterm premature rupture of membranes, onset of labor within 24 hours of rupture, third trimester: Secondary | ICD-10-CM | POA: Diagnosis present

## 2016-12-03 DIAGNOSIS — Z833 Family history of diabetes mellitus: Secondary | ICD-10-CM

## 2016-12-03 DIAGNOSIS — G8929 Other chronic pain: Secondary | ICD-10-CM | POA: Diagnosis present

## 2016-12-03 DIAGNOSIS — O34219 Maternal care for unspecified type scar from previous cesarean delivery: Secondary | ICD-10-CM

## 2016-12-03 DIAGNOSIS — O34211 Maternal care for low transverse scar from previous cesarean delivery: Principal | ICD-10-CM | POA: Diagnosis present

## 2016-12-03 DIAGNOSIS — O09529 Supervision of elderly multigravida, unspecified trimester: Secondary | ICD-10-CM

## 2016-12-03 DIAGNOSIS — M545 Low back pain: Secondary | ICD-10-CM | POA: Diagnosis present

## 2016-12-03 DIAGNOSIS — Z3480 Encounter for supervision of other normal pregnancy, unspecified trimester: Secondary | ICD-10-CM

## 2016-12-03 DIAGNOSIS — Z3A33 33 weeks gestation of pregnancy: Secondary | ICD-10-CM

## 2016-12-03 HISTORY — DX: Other specified health status: Z78.9

## 2016-12-03 LAB — CBC WITH DIFFERENTIAL/PLATELET
BASOS ABS: 0 10*3/uL (ref 0.0–0.1)
Basophils Relative: 0 %
EOS PCT: 0 %
Eosinophils Absolute: 0 10*3/uL (ref 0.0–0.7)
HEMATOCRIT: 28.7 % — AB (ref 36.0–46.0)
Hemoglobin: 9.5 g/dL — ABNORMAL LOW (ref 12.0–15.0)
LYMPHS ABS: 1.2 10*3/uL (ref 0.7–4.0)
LYMPHS PCT: 8 %
MCH: 28.4 pg (ref 26.0–34.0)
MCHC: 33.1 g/dL (ref 30.0–36.0)
MCV: 85.7 fL (ref 78.0–100.0)
MONO ABS: 0.2 10*3/uL (ref 0.1–1.0)
Monocytes Relative: 2 %
NEUTROS ABS: 13.5 10*3/uL — AB (ref 1.7–7.7)
Neutrophils Relative %: 90 %
PLATELETS: 218 10*3/uL (ref 150–400)
RBC: 3.35 MIL/uL — ABNORMAL LOW (ref 3.87–5.11)
RDW: 14.1 % (ref 11.5–15.5)
WBC: 15 10*3/uL — ABNORMAL HIGH (ref 4.0–10.5)

## 2016-12-03 LAB — CBC
HCT: 32.9 % — ABNORMAL LOW (ref 36.0–46.0)
Hemoglobin: 10.8 g/dL — ABNORMAL LOW (ref 12.0–15.0)
MCH: 28.2 pg (ref 26.0–34.0)
MCHC: 32.8 g/dL (ref 30.0–36.0)
MCV: 85.9 fL (ref 78.0–100.0)
PLATELETS: 128 10*3/uL — AB (ref 150–400)
RBC: 3.83 MIL/uL — AB (ref 3.87–5.11)
RDW: 14.4 % (ref 11.5–15.5)
WBC: 7.6 10*3/uL (ref 4.0–10.5)

## 2016-12-03 LAB — GROUP B STREP BY PCR: GROUP B STREP BY PCR: NEGATIVE

## 2016-12-03 LAB — PREPARE RBC (CROSSMATCH)

## 2016-12-03 LAB — POCT FERN TEST: POCT Fern Test: POSITIVE

## 2016-12-03 LAB — ABO/RH: ABO/RH(D): B POS

## 2016-12-03 SURGERY — Surgical Case
Anesthesia: Spinal | Site: Abdomen | Wound class: Clean Contaminated

## 2016-12-03 MED ORDER — DIBUCAINE 1 % RE OINT: 1.0000 "application " | TOPICAL_OINTMENT | RECTAL | Status: DC | PRN

## 2016-12-03 MED ORDER — ZOLPIDEM TARTRATE 5 MG PO TABS: 5.0000 mg | ORAL_TABLET | Freq: Every evening | ORAL | Status: DC | PRN

## 2016-12-03 MED ORDER — NALOXONE HCL 0.4 MG/ML IJ SOLN: 0.4000 mg | INTRAMUSCULAR | Status: DC | PRN

## 2016-12-03 MED ORDER — KETOROLAC TROMETHAMINE 30 MG/ML IJ SOLN
INTRAMUSCULAR | Status: DC | PRN
  Administered 2016-12-03: 30 mg via INTRAVENOUS

## 2016-12-03 MED ORDER — IBUPROFEN 600 MG PO TABS
600.0000 mg | ORAL_TABLET | Freq: Four times a day (QID) | ORAL | Status: DC
  Administered 2016-12-03 – 2016-12-06 (×12): 600 mg via ORAL
  Filled 2016-12-03 (×13): qty 1

## 2016-12-03 MED ORDER — IBUPROFEN 600 MG PO TABS
600.0000 mg | ORAL_TABLET | Freq: Four times a day (QID) | ORAL | Status: DC | PRN
  Administered 2016-12-04: 600 mg via ORAL

## 2016-12-03 MED ORDER — ACETAMINOPHEN 500 MG PO TABS
1000.0000 mg | ORAL_TABLET | Freq: Four times a day (QID) | ORAL | Status: AC
  Administered 2016-12-03 – 2016-12-04 (×3): 1000 mg via ORAL
  Filled 2016-12-03 (×4): qty 2

## 2016-12-03 MED ORDER — NALBUPHINE HCL 10 MG/ML IJ SOLN: 5.0000 mg | INTRAMUSCULAR | Status: DC | PRN

## 2016-12-03 MED ORDER — NALBUPHINE HCL 10 MG/ML IJ SOLN: 5.0000 mg | Freq: Once | INTRAMUSCULAR | Status: DC | PRN

## 2016-12-03 MED ORDER — ONDANSETRON HCL 4 MG/2ML IJ SOLN
INTRAMUSCULAR | Status: AC
  Filled 2016-12-03: qty 2

## 2016-12-03 MED ORDER — ONDANSETRON HCL 4 MG/2ML IJ SOLN
INTRAMUSCULAR | Status: DC | PRN
  Administered 2016-12-03: 4 mg via INTRAVENOUS

## 2016-12-03 MED ORDER — PHENYLEPHRINE 8 MG IN D5W 100 ML (0.08MG/ML) PREMIX OPTIME
INJECTION | INTRAVENOUS | Status: AC
  Filled 2016-12-03: qty 100

## 2016-12-03 MED ORDER — KETOROLAC TROMETHAMINE 30 MG/ML IJ SOLN: 30.0000 mg | Freq: Four times a day (QID) | INTRAMUSCULAR | Status: AC | PRN

## 2016-12-03 MED ORDER — MEPERIDINE HCL 25 MG/ML IJ SOLN: 6.2500 mg | INTRAMUSCULAR | Status: DC | PRN

## 2016-12-03 MED ORDER — LACTATED RINGERS IV SOLN
INTRAVENOUS | Status: DC | PRN
  Administered 2016-12-03: 08:00:00 via INTRAVENOUS

## 2016-12-03 MED ORDER — BUPIVACAINE HCL (PF) 0.5 % IJ SOLN
INTRAMUSCULAR | Status: AC
  Filled 2016-12-03: qty 30

## 2016-12-03 MED ORDER — BUPIVACAINE IN DEXTROSE 0.75-8.25 % IT SOLN
INTRATHECAL | Status: DC | PRN
Start: 2016-12-03 — End: 2016-12-03
  Administered 2016-12-03: 1.4 mL via INTRATHECAL

## 2016-12-03 MED ORDER — SCOPOLAMINE 1 MG/3DAYS TD PT72
MEDICATED_PATCH | TRANSDERMAL | Status: AC
  Filled 2016-12-03: qty 1

## 2016-12-03 MED ORDER — OXYCODONE-ACETAMINOPHEN 5-325 MG PO TABS
1.0000 | ORAL_TABLET | ORAL | Status: DC | PRN
  Administered 2016-12-03 – 2016-12-06 (×8): 1 via ORAL
  Filled 2016-12-03 (×8): qty 1

## 2016-12-03 MED ORDER — OXYCODONE-ACETAMINOPHEN 5-325 MG PO TABS
2.0000 | ORAL_TABLET | ORAL | Status: DC | PRN
  Administered 2016-12-05 (×2): 2 via ORAL
  Filled 2016-12-03 (×3): qty 2

## 2016-12-03 MED ORDER — SIMETHICONE 80 MG PO CHEW: 80.0000 mg | CHEWABLE_TABLET | ORAL | Status: DC | PRN

## 2016-12-03 MED ORDER — WITCH HAZEL-GLYCERIN EX PADS: 1.0000 "application " | MEDICATED_PAD | CUTANEOUS | Status: DC | PRN

## 2016-12-03 MED ORDER — FENTANYL CITRATE (PF) 100 MCG/2ML IJ SOLN: 25.0000 ug | INTRAMUSCULAR | Status: DC | PRN

## 2016-12-03 MED ORDER — CEFAZOLIN SODIUM-DEXTROSE 2-4 GM/100ML-% IV SOLN
2.0000 g | INTRAVENOUS | Status: AC
  Administered 2016-12-03: 2 g via INTRAVENOUS
  Filled 2016-12-03: qty 100

## 2016-12-03 MED ORDER — PHENYLEPHRINE 8 MG IN D5W 100 ML (0.08MG/ML) PREMIX OPTIME
INJECTION | INTRAVENOUS | Status: DC | PRN
  Administered 2016-12-03: 60 ug/min via INTRAVENOUS

## 2016-12-03 MED ORDER — SODIUM CHLORIDE 0.9% FLUSH: 3.0000 mL | INTRAVENOUS | Status: DC | PRN

## 2016-12-03 MED ORDER — TETANUS-DIPHTH-ACELL PERTUSSIS 5-2.5-18.5 LF-MCG/0.5 IM SUSP
0.5000 mL | Freq: Once | INTRAMUSCULAR | Status: AC
  Filled 2016-12-03: qty 0.5

## 2016-12-03 MED ORDER — LACTATED RINGERS IV SOLN
INTRAVENOUS | Status: DC | PRN
  Administered 2016-12-03: 40 [IU] via INTRAVENOUS

## 2016-12-03 MED ORDER — FENTANYL CITRATE (PF) 100 MCG/2ML IJ SOLN
INTRAMUSCULAR | Status: AC
  Filled 2016-12-03: qty 2

## 2016-12-03 MED ORDER — ACETAMINOPHEN 325 MG PO TABS
650.0000 mg | ORAL_TABLET | ORAL | Status: DC | PRN
  Administered 2016-12-04: 650 mg via ORAL
  Filled 2016-12-03: qty 2

## 2016-12-03 MED ORDER — SCOPOLAMINE 1 MG/3DAYS TD PT72
MEDICATED_PATCH | TRANSDERMAL | Status: DC | PRN
  Administered 2016-12-03: 1 via TRANSDERMAL

## 2016-12-03 MED ORDER — NALOXONE HCL 2 MG/2ML IJ SOSY
1.0000 ug/kg/h | PREFILLED_SYRINGE | INTRAVENOUS | Status: DC | PRN
  Filled 2016-12-03: qty 2

## 2016-12-03 MED ORDER — SIMETHICONE 80 MG PO CHEW
80.0000 mg | CHEWABLE_TABLET | ORAL | Status: DC
  Administered 2016-12-04 – 2016-12-06 (×3): 80 mg via ORAL
  Filled 2016-12-03 (×3): qty 1

## 2016-12-03 MED ORDER — LACTATED RINGERS IV BOLUS (SEPSIS)
500.0000 mL | Freq: Once | INTRAVENOUS | Status: AC
  Administered 2016-12-03: 500 mL via INTRAVENOUS

## 2016-12-03 MED ORDER — COCONUT OIL OIL
1.0000 "application " | TOPICAL_OIL | Status: DC | PRN
  Filled 2016-12-03: qty 120

## 2016-12-03 MED ORDER — FENTANYL CITRATE (PF) 100 MCG/2ML IJ SOLN
INTRAMUSCULAR | Status: DC | PRN
  Administered 2016-12-03: 20 ug via INTRATHECAL

## 2016-12-03 MED ORDER — MORPHINE SULFATE (PF) 0.5 MG/ML IJ SOLN
INTRAMUSCULAR | Status: AC
  Filled 2016-12-03: qty 10

## 2016-12-03 MED ORDER — SENNOSIDES-DOCUSATE SODIUM 8.6-50 MG PO TABS
2.0000 | ORAL_TABLET | ORAL | Status: DC
  Administered 2016-12-04 (×2): 2 via ORAL
  Filled 2016-12-03 (×2): qty 2

## 2016-12-03 MED ORDER — DIPHENHYDRAMINE HCL 50 MG/ML IJ SOLN: 12.5000 mg | INTRAMUSCULAR | Status: DC | PRN

## 2016-12-03 MED ORDER — FAMOTIDINE IN NACL 20-0.9 MG/50ML-% IV SOLN
INTRAVENOUS | Status: AC
  Filled 2016-12-03: qty 50

## 2016-12-03 MED ORDER — SOD CITRATE-CITRIC ACID 500-334 MG/5ML PO SOLN
30.0000 mL | ORAL | Status: AC
  Administered 2016-12-03: 30 mL via ORAL
  Filled 2016-12-03: qty 15

## 2016-12-03 MED ORDER — SODIUM CHLORIDE 0.9 % IR SOLN
Status: DC | PRN
  Administered 2016-12-03: 1000 mL

## 2016-12-03 MED ORDER — MORPHINE SULFATE (PF) 0.5 MG/ML IJ SOLN
INTRAMUSCULAR | Status: DC | PRN
  Administered 2016-12-03: .2 mg via INTRATHECAL

## 2016-12-03 MED ORDER — ONDANSETRON HCL 4 MG/2ML IJ SOLN: 4.0000 mg | Freq: Three times a day (TID) | INTRAMUSCULAR | Status: DC | PRN

## 2016-12-03 MED ORDER — DIPHENHYDRAMINE HCL 25 MG PO CAPS: 25.0000 mg | ORAL_CAPSULE | Freq: Four times a day (QID) | ORAL | Status: DC | PRN

## 2016-12-03 MED ORDER — LACTATED RINGERS IV SOLN
INTRAVENOUS | Status: DC
  Administered 2016-12-03 (×3): via INTRAVENOUS

## 2016-12-03 MED ORDER — OXYTOCIN 10 UNIT/ML IJ SOLN
INTRAMUSCULAR | Status: AC
  Filled 2016-12-03: qty 4

## 2016-12-03 MED ORDER — LACTATED RINGERS IV SOLN
INTRAVENOUS | Status: DC
  Administered 2016-12-03 (×2): via INTRAVENOUS

## 2016-12-03 MED ORDER — BETAMETHASONE SOD PHOS & ACET 6 (3-3) MG/ML IJ SUSP
12.0000 mg | Freq: Once | INTRAMUSCULAR | Status: DC
  Filled 2016-12-03: qty 2

## 2016-12-03 MED ORDER — LACTATED RINGERS IV SOLN: INTRAVENOUS | Status: DC | PRN

## 2016-12-03 MED ORDER — OXYTOCIN 40 UNITS IN LACTATED RINGERS INFUSION - SIMPLE MED: 2.5000 [IU]/h | INTRAVENOUS | Status: AC

## 2016-12-03 MED ORDER — DIPHENHYDRAMINE HCL 25 MG PO CAPS
25.0000 mg | ORAL_CAPSULE | ORAL | Status: DC | PRN
  Filled 2016-12-03: qty 1

## 2016-12-03 MED ORDER — SIMETHICONE 80 MG PO CHEW
80.0000 mg | CHEWABLE_TABLET | Freq: Three times a day (TID) | ORAL | Status: DC
  Administered 2016-12-03 – 2016-12-06 (×10): 80 mg via ORAL
  Filled 2016-12-03 (×10): qty 1

## 2016-12-03 MED ORDER — MENTHOL 3 MG MT LOZG: 1.0000 | LOZENGE | OROMUCOSAL | Status: DC | PRN

## 2016-12-03 MED ORDER — PRENATAL MULTIVITAMIN CH
1.0000 | ORAL_TABLET | Freq: Every day | ORAL | Status: DC
  Administered 2016-12-03 – 2016-12-06 (×4): 1 via ORAL
  Filled 2016-12-03 (×4): qty 1

## 2016-12-03 SURGICAL SUPPLY — 38 items
BENZOIN TINCTURE PRP APPL 2/3 (GAUZE/BANDAGES/DRESSINGS) ×2 IMPLANT
CHLORAPREP W/TINT 26ML (MISCELLANEOUS) ×2 IMPLANT
CLAMP CORD UMBIL (MISCELLANEOUS) IMPLANT
CLOTH BEACON ORANGE TIMEOUT ST (SAFETY) ×2 IMPLANT
DRAPE C SECTION CLR SCREEN (DRAPES) ×2 IMPLANT
DRSG OPSITE POSTOP 4X10 (GAUZE/BANDAGES/DRESSINGS) ×2 IMPLANT
ELECT REM PT RETURN 9FT ADLT (ELECTROSURGICAL) ×2
ELECTRODE REM PT RTRN 9FT ADLT (ELECTROSURGICAL) ×1 IMPLANT
EXTRACTOR VACUUM M CUP 4 TUBE (SUCTIONS) IMPLANT
GLOVE BIO SURGEON STRL SZ7.5 (GLOVE) ×2 IMPLANT
GLOVE BIOGEL PI IND STRL 7.0 (GLOVE) ×1 IMPLANT
GLOVE BIOGEL PI INDICATOR 7.0 (GLOVE) ×1
GOWN STRL REUS W/TWL 2XL LVL3 (GOWN DISPOSABLE) ×2 IMPLANT
GOWN STRL REUS W/TWL LRG LVL3 (GOWN DISPOSABLE) ×4 IMPLANT
KIT ABG SYR 3ML LUER SLIP (SYRINGE) IMPLANT
NEEDLE HYPO 22GX1.5 SAFETY (NEEDLE) ×2 IMPLANT
NEEDLE HYPO 25X5/8 SAFETYGLIDE (NEEDLE) IMPLANT
NS IRRIG 1000ML POUR BTL (IV SOLUTION) ×2 IMPLANT
PACK C SECTION WH (CUSTOM PROCEDURE TRAY) ×2 IMPLANT
PAD OB MATERNITY 4.3X12.25 (PERSONAL CARE ITEMS) ×2 IMPLANT
PENCIL SMOKE EVAC W/HOLSTER (ELECTROSURGICAL) ×2 IMPLANT
RTRCTR C-SECT PINK 25CM LRG (MISCELLANEOUS) ×2 IMPLANT
STRIP CLOSURE SKIN 1/2X4 (GAUZE/BANDAGES/DRESSINGS) ×2 IMPLANT
SUT CHROMIC 1 CTX 36 (SUTURE) ×4 IMPLANT
SUT VIC AB 1 CT1 27 (SUTURE) ×2
SUT VIC AB 1 CT1 27XBRD ANTBC (SUTURE) ×2 IMPLANT
SUT VIC AB 2-0 CT1 (SUTURE) ×2 IMPLANT
SUT VIC AB 2-0 CT1 27 (SUTURE) ×1
SUT VIC AB 2-0 CT1 TAPERPNT 27 (SUTURE) ×1 IMPLANT
SUT VIC AB 3-0 CT1 27 (SUTURE) ×2
SUT VIC AB 3-0 CT1 TAPERPNT 27 (SUTURE) ×2 IMPLANT
SUT VIC AB 3-0 SH 27 (SUTURE)
SUT VIC AB 3-0 SH 27X BRD (SUTURE) IMPLANT
SUT VIC AB 4-0 KS 27 (SUTURE) ×2 IMPLANT
SYR BULB IRRIGATION 50ML (SYRINGE) ×2 IMPLANT
SYR CONTROL 10ML LL (SYRINGE) ×2 IMPLANT
TOWEL OR 17X24 6PK STRL BLUE (TOWEL DISPOSABLE) ×2 IMPLANT
TRAY FOLEY CATH SILVER 14FR (SET/KITS/TRAYS/PACK) ×2 IMPLANT

## 2016-12-03 NOTE — Transfer of Care (Signed)
Immediate Anesthesia Transfer of Care Note  Patient: Chelsea Cook  Procedure(s) Performed: Procedure(s): CESAREAN SECTION (N/A)  Patient Location: PACU  Anesthesia Type:Spinal  Level of Consciousness: awake, alert  and oriented  Airway & Oxygen Therapy: Patient Spontanous Breathing  Post-op Assessment: Report given to RN and Post -op Vital signs reviewed and stable  Post vital signs: Reviewed and stable  Last Vitals:  Vitals:   12/03/16 0529  BP: 129/89  Pulse: 84  Resp: 17  Temp: 36.9 C    Last Pain:  Vitals:   12/03/16 0713  TempSrc:   PainSc: 0-No pain         Complications: No apparent anesthesia complications

## 2016-12-03 NOTE — Lactation Note (Addendum)
This note was copied from a baby's chart. Lactation Consultation Note  Patient Name: Chelsea Cook WUJWJ'XToday's Date: 12/03/2016 Reason for consult: Initial assessment;NICU baby;Infant < 6lbs   Initial consult with Exp BF mom of 7 hour old 33w 3d GA NICU infant. Spoke with mother using [employer]pre[employer] [phone] via Ipad. Mom reports she BF her 2 older children. Mom reports + breast changes with pregnancy.   Gave mom Providing Milk for Your Baby in NICU Booklet. Mom reports she can read some [place]. Reviewed book with her. Discussed Breast milk storage and labeling. Discussed pumping every 2-3 hours for 15 minutes on Initiate setting followed by hand expression. Mom was able to hand express and small amount colostrum noted. Mom with large compressible breasts and areola and everted nipples. Yellow # stickers. Advised mom to ask for BM labels in NICU.   Set up DEBP with instructions for use on Initiate setting, assembling, disassembling and cleaning of pump parts. Enc mom to call out for help with pumping as needed. Mom was sleepy with consult and was by herself so may need teaching reiterated.   BF Resources Handout and LC Brochure given, mom informed of IP/OP Services and BF Support Groups.   Mom reports she has private insurance, enc her to call and find out if they provide a breast pump. Discussed with mom that she will need to be pumping for several weeks since infant in NICU. Discussed with mom that there are pumping rooms in NICU and 2 week pump rentals are available at d/c.        Maternal Data Formula Feeding for Exclusion: No Has patient been taught Hand Expression?: Yes Does the patient have breastfeeding experience prior to this delivery?: Yes  Feeding    LATCH Score/Interventions                      Lactation Tools Discussed/Used WIC Program: No Pump Review: Setup, frequency, and cleaning;Milk Storage Initiated by:: Noralee StainSharon Ova Meegan, RN, IBCLC Date initiated::  12/03/16   Consult Status Consult Status: Follow-up Date: 12/04/16 Follow-up type: In-patient    Chelsea Cook 12/03/2016, 2:59 PM

## 2016-12-03 NOTE — Consult Note (Signed)
[facility]  Delivery Note:  C-section       12/03/2016  8:16 AM  I was called to the operating room at the request of the patient's obstetrician (Dr. Alysia PennaErvin) for a repeat c-section at 33 and 3/[redacted] weeks gestation.  PRENATAL HX:  This is a 36 y/o G5P2022 at 6933 and 3/[redacted] weeks gestation who was admitted this morning for SROM ~ 2 AM (ROM x6 hours, clear fluid).  She received the majority of her [place] in [place], and the only known pregnancy complication in addition to preterm labor is AMA and unknown GBS status.  Infant delivered by c-section as she has a history of prior c-section x2.     DELIVERY:  Infant was vigorous at delivery, initially requiring no resuscitation other than standard warming, drying and stimulation.  APGARs 8 and 8.  By 5 minutes, O2 saturations in low 80s with comfortable work of breathing so blow by O2 briefly applied.  He then began to have subcostal retractions, so CPAP +5, 21% applied and O2 saturations rose to 90s.  Exam within normal limits.  Infant transported to NICU on CPAP +5, 21%. _____________________ Electronically Signed By: Maryan CharLindsey Sandee Bernath, MD Neonatologist

## 2016-12-03 NOTE — Op Note (Signed)
Cesarean Section Procedure Note  12/03/2016  8:47 AM  PATIENT:  Chelsea Cook  36 y.o. female  PRE-OPERATIVE DIAGNOSIS:  IUP PROM Repeat C-Section 33 1/2 weeks   POST-OPERATIVE DIAGNOSIS:  IUP PROM Repeat C-Section 33 1/2 weeks   PROCEDURE:  Procedure(s): CESAREAN SECTION (N/A)  SURGEON:  Surgeon(s) and Role:    * Hermina StaggersMichael L Kelvin Sennett, MD - Primary  ASSISTANTS: none   ANESTHESIA:   spinal  EBL:  Total I/O In: 2600 [I.V.:2600] Out: 1100 [Urine:500; Blood:600]  BLOOD ADMINISTERED:none  DRAINS: none   LOCAL MEDICATIONS USED:  NONE  SPECIMEN:  Source of Specimen:  placenta  DISPOSITION OF SPECIMEN:  PATHOLOGY   Procedure Details   The patient presented to the MAU with PROM. H/O of prior c section x 2. Prenatal care initially here but since [place]. The risks, benefits, complications, treatment options, and expected outcomes were discussed with the patient.  The patient concurred with the proposed plan, giving informed consent.  The site of surgery properly noted/marked. The patient was taken to Operating Room # 1, identified as Chelsea Cook and the procedure verified as C-Section Delivery. A Time Out was held and the above information confirmed.  After induction of anesthesia, the patient was draped and prepped in the usual sterile manner. A Pfannenstiel incision was made and carried down through the subcutaneous tissue to the fascia. Fascial incision was made and extended transversely. The fascia was separated from the underlying rectus tissue superiorly and inferiorly. The peritoneum was identified and entered. Peritoneal incision was extended longitudinally. Alexis self retaining retrractor was placed. Bladder blade was positioned and bladder flap was created. Bladder blade repositioned.  A low transverse uterine incision was made. A VMI was delivered without problems. Cord was clamped after a 1 minute delay. Cord blood was collected. Placenta was manually  extracted and sent to pathology. Apgars and weight as recodred.  The uterine outline, tubes and ovaries appeared normal. The uterine incision was closed with running locked sutures of # 1 Chromic in a locking fashion. A suture of the same was used to complete a two layer closure.. Hemostasis was observed. Lavage was carried out until clear. The abdominal muscle and peritoneum were closed with 2/0 Vicryl.  The fascia was then reapproximated with running sutures of # 1 Vicryl from connor to connor. . The skin was reapproximated with Vicryl.  Instrument, sponge, and needle counts were correct prior the abdominal closure and at the conclusion of the case.   Complications:  None; patient tolerated the procedure well.  COUNTS:  YES  PLAN OF CARE: Admit to inpatient   PATIENT DISPOSITION:  PACU - hemodynamically stable.   Delay start of Pharmacological VTE agent (>24hrs) due to surgical blood loss or risk of bleeding: not applicable             Disposition: PACU - hemodynamically stable.         Condition: stable   Hermina StaggersMichael L Najma Bozarth, MD 12/03/2016 8:47 AM

## 2016-12-03 NOTE — Addendum Note (Signed)
Addendum  created 12/03/16 2000 by Graciela HusbandsWynn O Kendryck Lacroix, CRNA   Sign clinical note

## 2016-12-03 NOTE — Anesthesia Postprocedure Evaluation (Signed)
Anesthesia Post Note  Patient: Emiyah Zurn  Procedure(s) Performed: Procedure(s) (LRB): CESAREAN SECTION (N/A)  Patient location during evaluation: Mother Baby Anesthesia Type: Spinal Level of consciousness: awake and alert and oriented Pain management: satisfactory to patient Vital Signs Assessment: post-procedure vital signs reviewed and stable Respiratory status: respiratory function stable and spontaneous breathing Cardiovascular status: blood pressure returned to baseline Postop Assessment: no headache, no backache, spinal receding, patient able to bend at knees and adequate PO intake Anesthetic complications: no        Last Vitals:  Vitals:   12/03/16 1613 12/03/16 1834  BP: (!) 87/50 (!) 104/54  Pulse: 69 60  Resp: 17 18  Temp: 36.8 C 36.8 C    Last Pain:  Vitals:   12/03/16 1834  TempSrc: Oral  PainSc:    Pain Goal:                 Annayah Worthley

## 2016-12-03 NOTE — Anesthesia Postprocedure Evaluation (Signed)
Anesthesia Post Note  Patient: Chelsea Cook  Procedure(s) Performed: Procedure(s) (LRB): CESAREAN SECTION (N/A)  Patient location during evaluation: PACU Anesthesia Type: Spinal Level of consciousness: oriented and awake and alert Pain management: pain level controlled Vital Signs Assessment: post-procedure vital signs reviewed and stable Respiratory status: spontaneous breathing, respiratory function stable and nonlabored ventilation Cardiovascular status: blood pressure returned to baseline and stable Postop Assessment: no headache, no backache, spinal receding, no signs of nausea or vomiting and patient able to bend at knees Anesthetic complications: no        Last Vitals:  Vitals:   12/03/16 1038 12/03/16 1115  BP: 101/80 101/67  Pulse: 80 66  Resp: 17 18  Temp: 36.8 C 36.9 C    Last Pain:  Vitals:   12/03/16 1115  TempSrc: Oral  PainSc:    Pain Goal:                 Chelsea Rodger A.

## 2016-12-03 NOTE — Anesthesia Preprocedure Evaluation (Signed)
Anesthesia Evaluation  Patient identified by MRN, date of birth, ID band Patient awake    Reviewed: Allergy & Precautions, H&P , Patient's Chart, lab work & pertinent test results  Airway Mallampati: II  TM Distance: >3 FB Neck ROM: full    Dental no notable dental hx.    Pulmonary    Pulmonary exam normal breath sounds clear to auscultation       Cardiovascular Exercise Tolerance: Good  Rhythm:regular Rate:Normal     Neuro/Psych    GI/Hepatic   Endo/Other    Renal/GU      Musculoskeletal   Abdominal   Peds  Hematology   Anesthesia Other Findings Limited Hx due to language barrier; even with phone translator Recently healthy and had regional x 2 for previous CS  Reproductive/Obstetrics                             Anesthesia Physical Anesthesia Plan  ASA: II and emergent  Anesthesia Plan: Spinal   Post-op Pain Management:    Induction:   Airway Management Planned:   Additional Equipment:   Intra-op Plan:   Post-operative Plan:   Informed Consent: I have reviewed the patients History and Physical, chart, labs and discussed the procedure including the risks, benefits and alternatives for the proposed anesthesia with the patient or authorized representative who has indicated his/her understanding and acceptance.   Dental Advisory Given  Plan Discussed with: CRNA  Anesthesia Plan Comments: (Lab work and procedure  confirmed with CRNA in room; platelets okay. Discussed spinal anesthetic, and patient consents to the procedure:  included risk of possible headache,backache, failed block, allergic reaction, and nerve injury. This patient was asked if she had any questions or concerns before the procedure started.  )        Anesthesia Quick Evaluation

## 2016-12-03 NOTE — MAU Note (Signed)
Pt felt leaking of fluid since 0230

## 2016-12-03 NOTE — Anesthesia Procedure Notes (Signed)
Spinal  Patient location during procedure: OR Start time: 12/03/2016 7:31 AM Staffing Anesthesiologist: Mal AmabileFOSTER, Deedee Lybarger Performed: anesthesiologist  Preanesthetic Checklist Completed: patient identified, site marked, surgical consent, pre-op evaluation, timeout performed, IV checked, risks and benefits discussed and monitors and equipment checked Spinal Block Patient position: sitting Prep: site prepped and draped and DuraPrep Patient monitoring: heart rate, cardiac monitor, continuous pulse ox and blood pressure Approach: midline Location: L3-4 Injection technique: single-shot Needle Needle type: Sprotte  Needle gauge: 24 G Needle length: 9 cm Assessment Sensory level: T4 Additional Notes Patient tolerated procedure. Adequate sensory level.

## 2016-12-03 NOTE — H&P (Signed)
Chelsea Cook is a 36 y.o. female [id number] IUP 33 3/7 weeks presents to MAU with SROM @ 2 Am. She reports ut ctx q 2-5 mins. Reports good fetal movement.  [place] but [place] Reports receiving care while [place]. Last OB visit was 2 weeks ago. Reports care has been unremarkable. Reports her glucola was negative.  Pt is AMA and declined genetic testing. Her anatomy scan here in November was unremarkable and confirmed her dates.  OB History    Gravida Para Term Preterm AB Living   5 2 2   2 2    SAB TAB Ectopic Multiple Live Births   2       2     Past Medical History:  Diagnosis Date  . Chronic back pain    since childbirth, x several years as of 07/2014  . Chronic leg pain    left along with hip and back pain since childbirth as of 07/2014  . Low back pain 11/15/2015  . Medical history non-contributory   . Pelvic pain in female 11/15/2015  . Upper back pain 11/15/2015   Past Surgical History:  Procedure Laterality Date  . CESAREAN SECTION     x2   Family History: family history includes Diabetes in her father. Social History:  reports that she has never smoked. She has never used smokeless tobacco. She reports that she does not drink alcohol or use drugs.     Maternal Diabetes: No Genetic Screening: Declined Maternal Ultrasounds/Referrals: Normal Fetal Ultrasounds or other Referrals:  None Maternal Substance Abuse:  No Significant Maternal Medications:  None Significant Maternal Lab Results:  None Other Comments:  None  Review of Systems  Constitutional: Negative.   Respiratory: Negative.   Cardiovascular: Negative.   Gastrointestinal: Negative.   Genitourinary: Negative.    History Dilation: Closed Exam by:: Talya Quain MD Blood pressure 129/89, pulse 84, temperature 98.5 F (36.9 C), temperature source Oral, resp. rate 17, last menstrual period 04/13/2016, SpO2 100 %. Exam Physical Exam  Constitutional: She appears well-developed and  well-nourished.  Cardiovascular: Normal rate and regular rhythm.   Respiratory: Effort normal and breath sounds normal.  GI: Soft. Bowel sounds are normal.  Genitourinary:  Genitourinary Comments: Grossly ruptured, closed, vertex    Prenatal labs: ABO, Rh: B/Positive/-- (10/19 1619) Antibody: Negative (10/19 1619) Rubella: 4.11 (10/19 1619) RPR: Non Reactive (10/19 1619)  HBsAg: Negative (10/19 1619)  HIV: Non Reactive (10/19 1619)  GBS:     Assessment/Plan: IUP 33 3/7 weeks SROM Prior c section x 2  Will proceed toward delivery. C section d/t prior c section. Cel[place] x 1 for FLM. R/B of c section reviewed with pt via interrupter. Pt verbalized understanding.   Chelsea Cook 12/03/2016, 6:30 AM

## 2016-12-04 LAB — CBC
HCT: 25.9 % — ABNORMAL LOW (ref 36.0–46.0)
Hemoglobin: 8.6 g/dL — ABNORMAL LOW (ref 12.0–15.0)
MCH: 28.3 pg (ref 26.0–34.0)
MCHC: 33.2 g/dL (ref 30.0–36.0)
MCV: 85.2 fL (ref 78.0–100.0)
PLATELETS: 225 10*3/uL (ref 150–400)
RBC: 3.04 MIL/uL — ABNORMAL LOW (ref 3.87–5.11)
RDW: 14.5 % (ref 11.5–15.5)
WBC: 15.1 10*3/uL — ABNORMAL HIGH (ref 4.0–10.5)

## 2016-12-04 LAB — RPR: RPR Ser Ql: NONREACTIVE

## 2016-12-04 NOTE — Progress Notes (Signed)
Pt called out requesting pain medication for a pain at a 10 on the scale 0-10. I offered the patient percocet and she denied and stated she only wanted tylenol and a heating pad like she received per the pt yesterday. I offered to call a interpeter and she refused stating she just wanted something for her pain tylenol for her pain.

## 2016-12-04 NOTE — Progress Notes (Signed)
Patient screened out for psychosocial assessment since none of the following apply:  Psychosocial stressors documented in mother or baby's chart  Gestation less than 32 weeks  Code at delivery   Infant with anomalies Please contact the Clinical Social Worker if specific needs arise, or by MOB's request.   Emmakate Hypes Boyd-Gilyard, MSW, LCSW Clinical Social Work [phone]  

## 2016-12-04 NOTE — Lactation Note (Signed)
This note was copied from a baby's chart. Lactation Consultation Note  Patient Name: Chelsea Cook ZOXWR'UToday's Date: 12/04/2016 Reason for consult: Follow-up assessment;NICU baby  NICU baby 7634 hours old. Assisted by [place] interpreter via ipad, McLainoufiquzzaman, [phone]. Mom reports that she has been pumping, but not getting anything. Discussed progression of milk coming to volume and enc mom to pump 8-12 times/24 hours followed by hand expression. Mom had questions about getting a DEBP. Enc mom to call her insurance company, and mom stated that she would discuss with her husband. Mom given paperwork for 2-week rental. Mom requested assistance with enrolling with WIC--[employer]. Discussed with patient's bedside nurse, Ms. Donzetta MattersGalloway, RN who will make sure SS is notified.   Maternal Data    Feeding Feeding Type: (P) Donor Breast Milk Length of feed: (P) 30 min  LATCH Score/Interventions                      Lactation Tools Discussed/Used     Consult Status Consult Status: Follow-up Date: 12/05/16 Follow-up type: In-patient    Sherlyn HayJennifer D Mazen Marcin 12/04/2016, 6:31 PM

## 2016-12-04 NOTE — Progress Notes (Signed)
Subjective: Postpartum Day 1: Cesarean Delivery Patient reports incisional pain and tolerating PO.    Objective: Vital signs in last 24 hours: Temp:  [97.5 F (36.4 C)-98.4 F (36.9 C)] 98.4 F (36.9 C) (03/06 0423) Pulse Rate:  [55-89] 74 (03/06 0423) Resp:  [11-19] 18 (03/06 0423) BP: (87-115)/(49-88) 97/49 (03/06 0423) SpO2:  [95 %-100 %] 100 % (03/06 0423)  Physical Exam:  General: alert, cooperative and no distress Lochia: appropriate Uterine Fundus: firm Incision: healing well, no significant drainage DVT Evaluation: No evidence of DVT seen on physical exam.   Recent Labs  12/03/16 1745 12/04/16 0535  HGB 9.5* 8.6*  HCT 28.7* 25.9*    Assessment/Plan: Status post Cesarean section. Doing well postoperatively.   Breast pumping Continue current care.  Wynelle BourgeoisMarie Sophonie Goforth 12/04/2016, 7:09 AM

## 2016-12-05 MED ORDER — POLYSACCHARIDE IRON COMPLEX 150 MG PO CAPS
150.0000 mg | ORAL_CAPSULE | Freq: Two times a day (BID) | ORAL | Status: DC
Start: 2016-12-05 — End: 2016-12-06
  Administered 2016-12-05 – 2016-12-06 (×3): 150 mg via ORAL
  Filled 2016-12-05 (×5): qty 1

## 2016-12-05 MED ORDER — MAGNESIUM HYDROXIDE 400 MG/5ML PO SUSP
30.0000 mL | Freq: Every day | ORAL | Status: DC
  Administered 2016-12-05 – 2016-12-06 (×2): 30 mL via ORAL
  Filled 2016-12-05: qty 30

## 2016-12-05 MED ORDER — MAGNESIUM HYDROXIDE 400 MG/5ML PO SUSP
30.0000 mL | Freq: Every evening | ORAL | Status: DC | PRN
  Administered 2016-12-05: 30 mL via ORAL
  Filled 2016-12-05 (×2): qty 30

## 2016-12-05 MED ORDER — SENNOSIDES-DOCUSATE SODIUM 8.6-50 MG PO TABS
2.0000 | ORAL_TABLET | Freq: Two times a day (BID) | ORAL | Status: DC
  Administered 2016-12-05 – 2016-12-06 (×3): 2 via ORAL
  Filled 2016-12-05 (×3): qty 2

## 2016-12-05 NOTE — Lactation Note (Signed)
This note was copied from a baby's chart. Lactation Consultation Note  Patient Name: Boy Cassia Ephriam JenkinsDas WGNFA'OToday's Date: 12/05/2016 Reason for consult: Follow-up assessment   with this mom of  NICU baby, now 7850 hours old, now 4833 5/7 weeks CGA, and small, 3 lbs 14 oz. Mom did get to hold the baby skin to skin yesterday. I encouraged her to hold her baby STs as much as possible, and to pump at least 8 times a day, even if she does not express any colostrum . With hand expression, mom still not expressing any colostrum. I explained her milk may not transition in until 3-5 days post partum. Mom knows to call for questions/concerns.    Maternal Data    Feeding Feeding Type: Donor Breast Milk Length of feed: 30 min  LATCH Score/Interventions                      Lactation Tools Discussed/Used     Consult Status Consult Status: Follow-up Date: 12/06/16 Follow-up type: In-patient    Alfred LevinsLee, Saafir Abdullah Anne 12/05/2016, 10:23 AM

## 2016-12-05 NOTE — Progress Notes (Signed)
Post Partum Day 2 Subjective: up ad lib, voiding, tolerating PO, + flatus and complains of 10/10 abdominal pain at incision sites  Objective: Blood pressure 106/63, pulse 60, temperature 98.3 F (36.8 C), temperature source Oral, resp. rate 20, height 5' (1.524 m), weight 165 lb (74.8 kg), last menstrual period 04/13/2016, SpO2 100 %, unknown if currently breastfeeding.  Physical Exam:  General: alert, cooperative and no distress Lochia: appropriate Uterine Fundus: firm Incision: healing well, no significant drainage, no dehiscence, no significant erythema DVT Evaluation: No evidence of DVT seen on physical exam. Negative Homan's sign. No cords or calf tenderness. No significant calf/ankle edema.   Recent Labs  12/03/16 1745 12/04/16 0535  HGB 9.5* 8.6*  HCT 28.7* 25.9*    Assessment/Plan: Plan for discharge tomorrow   LOS: 2 days   Wendee Beaversavid J McMullen, DO, PGY-1 12/05/2016, 6:36 AM

## 2016-12-05 NOTE — Progress Notes (Signed)
Subjective: Postpartum Day #2: Cesarean Delivery Patient reports incisional pain, tolerating PO, + flatus and no problems voiding.    Objective: Vital signs in last 24 hours: Temp:  [98 F (36.7 C)-98.3 F (36.8 C)] 98.3 F (36.8 C) (03/07 0300) Pulse Rate:  [60-83] 60 (03/07 0300) Resp:  [18-20] 20 (03/07 0300) BP: (82-106)/(41-65) 106/63 (03/07 0300) SpO2:  [100 %] 100 % (03/07 0300)  Physical Exam:  General: alert, cooperative and no distress Lochia: appropriate Uterine Fundus: firm Incision: no significant drainage, no dehiscence, no significant erythema DVT Evaluation: No evidence of DVT seen on physical exam. No cords or calf tenderness. No significant calf/ankle edema.   Recent Labs  12/03/16 1745 12/04/16 0535  HGB 9.5* 8.6*  HCT 28.7* 25.9*    Assessment/Plan: Status post Cesarean section. Doing well postoperatively.  Continue current care.  Laxatives ordered for patient to have a BM. VSS.  Anemia: on iron, asymptomatic.  Is pumping every few hours.  Baby in NICU.  Planning discharge home in AM.   Roe Coombsachelle A Obdulio Mash, CNM 12/05/2016, 8:02 AM

## 2016-12-06 ENCOUNTER — Ambulatory Visit: Payer: Self-pay

## 2016-12-06 MED ORDER — OXYCODONE-ACETAMINOPHEN 5-325 MG PO TABS: 1.0000 | ORAL_TABLET | ORAL | 0 refills | Status: DC | PRN

## 2016-12-06 MED ORDER — CITRANATAL BLOOM 90-1 MG PO TABS: 1.0000 | ORAL_TABLET | Freq: Every day | ORAL | 6 refills | Status: DC

## 2016-12-06 MED ORDER — IBUPROFEN 600 MG PO TABS: 600.0000 mg | ORAL_TABLET | Freq: Four times a day (QID) | ORAL | 2 refills | Status: DC

## 2016-12-06 MED ORDER — BUTALBITAL-APAP-CAFFEINE 50-325-40 MG PO TABS: 1.0000 | ORAL_TABLET | Freq: Four times a day (QID) | ORAL | 3 refills | Status: DC | PRN

## 2016-12-06 MED ORDER — MAGNESIUM HYDROXIDE 400 MG/5ML PO SUSP: 30.0000 mL | Freq: Every evening | ORAL | 0 refills | Status: DC | PRN

## 2016-12-06 MED ORDER — SENNOSIDES-DOCUSATE SODIUM 8.6-50 MG PO TABS: 2.0000 | ORAL_TABLET | Freq: Two times a day (BID) | ORAL | 2 refills | Status: DC

## 2016-12-06 MED ORDER — TETANUS-DIPHTH-ACELL PERTUSSIS 5-2.5-18.5 LF-MCG/0.5 IM SUSP
0.5000 mL | Freq: Once | INTRAMUSCULAR | Status: AC
  Administered 2016-12-06: 0.5 mL via INTRAMUSCULAR

## 2016-12-06 NOTE — Lactation Note (Signed)
This note was copied from a baby's chart. Lactation Consultation Note  Patient Name: Chelsea Cook [id number]'s Date: 12/06/2016 Reason for consult: Follow-up assessment;NICU baby  NICU baby 4676 hours old. Assisted by [place] interpreter Syeda via ipad. Mom reports that she has pumped once today, and states that she has not been feeling well enough to pump d/t nausea and dizziness and being very tired. Discussed the need to pump 8-12 times/24 hours in order to enc a good breast milk supply. Mom reports that she understands. Reminded mom to take pump parts with her to use with DEBP at home and in NICU. Enc mom to call for this LC when her husband arrives in order to give her the DEBP rental. Mom aware of OP/BFSG and LC phone line assistance after D/C.   Maternal Data    Feeding Feeding Type: Donor Breast Milk Length of feed: 60 min  LATCH Score/Interventions                      Lactation Tools Discussed/Used     Consult Status Consult Status: PRN    Sherlyn HayJennifer D Giovanna Kemmerer 12/06/2016, 11:51 AM

## 2016-12-06 NOTE — Progress Notes (Signed)
Subjective: Postpartum Day #3: Cesarean Delivery Patient reports incisional pain, tolerating PO, + flatus, + BM and no problems voiding.  Reports right ear pain for a while. Debrox drops recommended.  Reports HA since start of pregnancy.    Objective: Vital signs in last 24 hours: Temp:  [97.8 F (36.6 C)-98.2 F (36.8 C)] 98.2 F (36.8 C) (03/07 1946) Pulse Rate:  [67-85] 85 (03/07 1946) Resp:  [18-20] 18 (03/07 1946) BP: (99-124)/(52-72) 119/72 (03/07 1946) SpO2:  [98 %-100 %] 100 % (03/07 1946)  Physical Exam:  General: alert, cooperative and no distress Lochia: appropriate Uterine Fundus: firm Incision: no significant drainage, no dehiscence, no significant erythema DVT Evaluation: No evidence of DVT seen on physical exam. No cords or calf tenderness. No significant calf/ankle edema.   Recent Labs  12/03/16 1745 12/04/16 0535  HGB 9.5* 8.6*  HCT 28.7* 25.9*    Assessment/Plan: Status post Cesarean section. Doing well postoperatively.  Discharge home with standard precautions and return to clinic in 4-6 weeks.  Chelsea Cook, CNM 12/06/2016, 7:30 AM

## 2016-12-06 NOTE — Lactation Note (Signed)
This note was copied from a baby's chart. Lactation Consultation Note  Patient Name: Chelsea Cook [id number]'s Date: 12/06/2016 Reason for consult: Follow-up assessment;NICU baby   Maternal Data    Feeding Feeding Type: Donor Breast Milk Length of feed: 60 min  LATCH Score/Interventions                      Lactation Tools Discussed/Used    Mom given DEBP rental.   Consult Status Consult Status: PRN    Sherlyn HayJennifer D Daryll Spisak 12/06/2016, 2:53 PM

## 2016-12-06 NOTE — Progress Notes (Signed)
Pt discharged to home with husband.  Discharge instructions reviewed with pt and husband together using Video Interpreter Syeda [phone]#[id number].  Pt to car via wheelchair with RN.  Stopped by NICU to hold baby for a few minutes before leaving.  Pt home with rented breast pump.  No other equipment for home ordered at discharge.

## 2016-12-06 NOTE — Discharge Summary (Signed)
OB Discharge Summary     Patient Name: Chelsea Cook DOB: 08/21/1981 MRN: [id number]  Date of admission: 12/03/2016 Delivering MD: Hermina Staggers   Date of discharge: 12/06/2016  Admitting diagnosis: 32 WEEKS RO ROM CTX 20 MIN APART Intrauterine pregnancy: [redacted]w[redacted]d     Secondary diagnosis:  Active Problems:   Postpartum care following cesarean delivery  Additional problems: SROM @33 .3/7 wks, with prior C-section X2     Discharge diagnosis: Preterm Pregnancy Delivered and RLTCS                                                                                                Post partum procedures:none  Augmentation: none  Complications: None  Hospital course:  Onset of Labor With Unplanned C/S  36 y.o. yo [id number] at [redacted]w[redacted]d was admitted in Latent Labor on 12/03/2016. Patient had a labor course significant for SROM @33wks . Membrane Rupture Time/Date: 2:30 AM ,12/03/2016   The patient went for cesarean section due to Prior Uterine Surgery, and delivered a Viable infant,12/03/2016  Details of operation can be found in separate operative note. Patient had an uncomplicated postpartum course.  She is ambulating,tolerating a regular diet, passing flatus, and urinating well.  Patient is discharged home in stable condition 12/06/16.  Physical exam  Vitals:   12/05/16 0800 12/05/16 1200 12/05/16 1451 12/05/16 1946  BP: 115/69 124/66 (!) 99/52 119/72  Pulse: 67 77 82 85  Resp: 20 20 20 18   Temp: 98.1 F (36.7 C) 97.9 F (36.6 C) 97.8 F (36.6 C) 98.2 F (36.8 C)  TempSrc: Oral Oral Oral Oral  SpO2: 100% 100% 98% 100%  Weight:      Height:       General: alert, cooperative and no distress Lochia: appropriate Uterine Fundus: firm Incision: Healing well with no significant drainage, Dressing is clean, dry, and intact DVT Evaluation: No evidence of DVT seen on physical exam. No cords or calf tenderness. No significant calf/ankle edema. Labs: Lab Results  Component Value Date   WBC 15.1 (H)  12/04/2016   HGB 8.6 (L) 12/04/2016   HCT 25.9 (L) 12/04/2016   MCV 85.2 12/04/2016   PLT 225 12/04/2016   CMP Latest Ref Rng & Units 11/15/2015  Glucose 65 - 99 mg/dL 81  BUN 7 - 25 mg/dL 8  Creatinine 1.47 - 8.29 mg/dL 5.62  Sodium 130 - 865 mmol/L 137  Potassium 3.5 - 5.3 mmol/L 4.7  Chloride 98 - 110 mmol/L 104  CO2 20 - 31 mmol/L 29  Calcium 8.6 - 10.2 mg/dL 9.6  Total Protein 6.1 - 8.1 g/dL 7.3  Total Bilirubin 0.2 - 1.2 mg/dL 0.4  Alkaline Phos 33 - 115 U/L 54  AST 10 - 30 U/L 19  ALT 6 - 29 U/L 17    Discharge instruction: per After Visit Summary and "Baby and Me Booklet".  After visit meds:  Allergies as of 12/06/2016   No Known Allergies     Medication List    TAKE these medications   acetaminophen 500 MG tablet Commonly known as:  TYLENOL Take 500 mg by mouth every  6 (six) hours as needed for moderate pain, fever or headache. Reported on 12/16/2015   butalbital-acetaminophen-caffeine 50-325-40 MG tablet Commonly known as:  FIORICET, ESGIC Take 1-2 tablets by mouth every 6 (six) hours as needed for headache.   CITRANATAL BLOOM 90-1 MG Tabs Take 1 tablet by mouth daily.   CITRANATAL HARMONY 27-1-260 MG Caps Take 1 capsule by mouth daily.   ibuprofen 600 MG tablet Commonly known as:  ADVIL,MOTRIN Take 1 tablet (600 mg total) by mouth every 6 (six) hours.   magnesium hydroxide 400 MG/5ML suspension Commonly known as:  MILK OF MAGNESIA Take 30 mLs by mouth at bedtime as needed for mild constipation or moderate constipation.   oxyCODONE-acetaminophen 5-325 MG tablet Commonly known as:  PERCOCET/ROXICET Take 1 tablet by mouth every 4 (four) hours as needed (pain scale 4-7).   senna-docusate 8.6-50 MG tablet Commonly known as:  Senokot-S Take 2 tablets by mouth 2 (two) times daily.       Diet: routine diet  Activity: Advance as tolerated. Pelvic rest for 6 weeks.   Outpatient follow up:4 weeks Follow up Appt:No future appointments. Follow up  Visit:No Follow-up on file.  Postpartum contraception: Undecided  Newborn Data: Live born female  Birth Weight: 4 lb 2.7 oz (1890 g) APGAR: 8, 8  Baby Feeding: Bottle and Breast Disposition:NICU   12/06/2016 Roe Coombsachelle A Ayrabella Labombard, CNM

## 2016-12-07 LAB — TYPE AND SCREEN
ABO/RH(D): B POS
Antibody Screen: NEGATIVE
UNIT DIVISION: 0
Unit division: 0

## 2016-12-07 LAB — BPAM RBC
BLOOD PRODUCT EXPIRATION DATE: 201804032359
Blood Product Expiration Date: 201803272359
ISSUE DATE / TIME: 201803040836
Unit Type and Rh: 5100
Unit Type and Rh: 5100

## 2017-01-14 ENCOUNTER — Encounter: Payer: Self-pay | Admitting: Certified Nurse Midwife

## 2017-01-14 ENCOUNTER — Ambulatory Visit (INDEPENDENT_AMBULATORY_CARE_PROVIDER_SITE_OTHER): Payer: Medicaid Other | Admitting: Certified Nurse Midwife

## 2017-01-14 ENCOUNTER — Encounter: Payer: Self-pay | Admitting: *Deleted

## 2017-01-14 VITALS — BP 130/88 | HR 77 | Wt 156.0 lb

## 2017-01-14 DIAGNOSIS — M792 Neuralgia and neuritis, unspecified: Secondary | ICD-10-CM

## 2017-01-14 DIAGNOSIS — K5901 Slow transit constipation: Secondary | ICD-10-CM

## 2017-01-14 MED ORDER — PRENATE RESTORE 27-0.6-0.4-400 MG PO CAPS: 1.0000 | ORAL_CAPSULE | Freq: Every day | ORAL | 12 refills | Status: DC

## 2017-01-14 MED ORDER — CYCLOBENZAPRINE HCL 10 MG PO TABS: 10.0000 mg | ORAL_TABLET | Freq: Three times a day (TID) | ORAL | 1 refills | Status: DC | PRN

## 2017-01-14 MED ORDER — POLYETHYLENE GLYCOL 3350 17 GM/SCOOP PO POWD
17.0000 g | Freq: Every day | ORAL | 0 refills | Status: DC
Start: 2017-01-14 — End: 2017-07-09

## 2017-01-14 NOTE — Progress Notes (Signed)
Patient wants Tubal. Papers signed 01/14/2017.  Subjective:     Chelsea Cook is a 36 y.o. female who presents for a postpartum visit. She is 6 weeks postpartum following a low cervical transverse Cesarean section. I have fully reviewed the prenatal and intrapartum course. The delivery was at 33 1/2 gestational weeks. Outcome: . Anesthesia: epidural. Postpartum course has been UNREMARKABLE. Baby's course has been UNREMARKABLE. Baby is feeding by breast. Bleeding no bleeding. Bowel function is normal. Bladder function is normal. Patient is not sexually active. Contraception method is none. Postpartum depression screening: negative.  Reports left sided pain from naval to upper thigh, sharp shooting pains.  Has had similar back pain since 2009 after epidural with first born child according to the husband.  Video interpreter used.  Also reports constipation, is having bowel movements every few days.  Eating a normal diet, fluids encouraged as well as colace and Miralax.  Encouraged Yoga exercising.    The following portions of the patient's history were reviewed and updated as appropriate: allergies, current medications, past family history, past medical history, past social history, past surgical history and problem list.  Review of Systems Pertinent items noted in HPI and remainder of comprehensive ROS otherwise negative.   Objective:    There were no vitals taken for this visit.  General:  alert, cooperative and no distress   Breasts:  inspection negative, no nipple discharge or bleeding, no masses or nodularity palpable  Lungs: clear to auscultation bilaterally  Heart:  regular rate and rhythm, S1, S2 normal, no murmur, click, rub or gallop  Abdomen: soft, non-tender; bowel sounds normal; no masses,  no organomegaly  C-section wound: C/D/I, wound edges well approximated, no s/s infection noted.  Non-tender to palpation.   Pelvic Exam: Not performed.        Assessment:     Normal 4 week postpartum  exam. Pap smear not done at today's visit.    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     Hx of normal pap smear 12/19/15    Contraception counseling: BTL paperwork completed today.  Plan:    1. Contraception: abstinence 2.  Needs pre-op consultation with surgeon, scheduled prior to BTL.  3. Follow up in: 3 weeks for consult.

## 2017-01-29 ENCOUNTER — Encounter: Payer: Self-pay | Admitting: Obstetrics and Gynecology

## 2017-01-29 ENCOUNTER — Ambulatory Visit (INDEPENDENT_AMBULATORY_CARE_PROVIDER_SITE_OTHER): Payer: Medicaid Other | Admitting: Obstetrics and Gynecology

## 2017-01-29 DIAGNOSIS — Z01818 Encounter for other preprocedural examination: Secondary | ICD-10-CM | POA: Diagnosis not present

## 2017-01-29 DIAGNOSIS — Z3009 Encounter for other general counseling and advice on contraception: Secondary | ICD-10-CM | POA: Diagnosis not present

## 2017-01-29 NOTE — Progress Notes (Signed)
Ms. Wollenberg is here for tubal pre op visit. Tubal papers were signed 01/14/17. She is recovering well form her 3rd c section  R/Failure rate and post op care reviewed with pt.  PE AF VSS Lungs clear  Heart RRR  A/P Pre-op for BTL  Patient desires bilateral tubal sterilization.  Other reversible forms of contraception were discussed with patient; she declines all other modalities. Discussed bilateral tubal sterilization in detail; discussed options of laparoscopic bilateral tubal sterilization using coagulation. Risks and benefits discussed in detail including but not limited to: risk of regret, permanence of method, bleeding, infection, injury to surrounding organs and need for additional procedures.  Failure risk of < 1 % with increased risk of ectopic gestation if pregnancy occurs was also discussed with patient.  Also discussed that tubal ligatation does not cause hormonal imbalance.  Patient verbalized understanding of these risks and benefits and wants to proceed with sterilization with laparoscopic bilateral sterilization using coagulation.  She was told that she will be contacted by our surgical scheduler regarding the time and date of her surgery; routine preoperative instructions of having nothing to eat or drink after midnight on the day prior to surgery and also coming to the hospital 1 1/2 hours prior to her time of surgery were also emphasized.  She was told she may be called for a preoperative appointment about a week prior to surgery and will be given further preoperative instructions at that visit.  Routine postoperative instructions will be reviewed with the patient and her family in detail after surgery. Printed patient education handouts about the procedure was given to the patient to review at home.  Medicaid papers have been signed , patient understands that surgery will be scheduled at least 30 days after the day papers are signed as per Medicaid guidelines.

## 2017-01-29 NOTE — Patient Instructions (Signed)
Laparoscopic Tubal Ligation Laparoscopic tubal ligation is a procedure to close the fallopian tubes. This is done so that you cannot get pregnant. When the fallopian tubes are closed, the eggs that your ovaries release cannot enter the uterus, and sperm cannot reach the released eggs. A laparoscopic tubal ligation is sometimes called "getting your tubes tied." You should not have this procedure if you want to get pregnant someday or if you are unsure about having more children. Tell a health care provider about:  Any allergies you have.  All medicines you are taking, including vitamins, herbs, eye drops, creams, and over-the-counter medicines.  Any problems you or family members have had with anesthetic medicines.  Any blood disorders you have.  Any surgeries you have had.  Any medical conditions you have.  Whether you are pregnant or may be pregnant.  Any past pregnancies. What are the risks? Generally, this is a safe procedure. However, problems may occur, including:  Infection.  Bleeding.  Injury to surrounding organs.  Side effects from anesthetics.  Failure of the procedure. This procedure can increase your risk of a kind of pregnancy in which a fertilized egg attaches to the outside of the uterus (ectopic pregnancy). What happens before the procedure?  Ask your health care provider about:  Changing or stopping your regular medicines. This is especially important if you are taking diabetes medicines or blood thinners.  Taking medicines such as aspirin and ibuprofen. These medicines can thin your blood. Do not take these medicines before your procedure if your health care provider instructs you not to.  Follow instructions from your health care provider about eating and drinking restrictions.  Plan to have someone take you home after the procedure.  If you go home right after the procedure, plan to have someone with you for 24 hours. What happens during the  procedure?  You will be given one or more of the following:  A medicine to help you relax (sedative).  A medicine to numb the area (local anesthetic).  A medicine to make you fall asleep (general anesthetic).  A medicine that is injected into an area of your body to numb everything below the injection site (regional anesthetic).  An IV tube will be inserted into one of your veins. It will be used to give you medicines and fluids during the procedure.  Your bladder may be emptied with a small tube (catheter).  If you have been given a general anesthetic, a tube will be put down your throat to help you breathe.  Two small cuts (incisions) will be made in your lower abdomen and near your belly button.  Your abdomen will be inflated with a gas. This will let the surgeon see better and will give the surgeon room to work.  A thin, lighted tube (laparoscope) with a camera attached will be inserted into your abdomen through one of the incisions. Small instruments will be inserted through the other incision.  The fallopian tubes will be tied off, burned (cauterized), or blocked with a clip, ring, or clamp. A small portion in the center of each fallopian tube may be removed.  The gas will be released from the abdomen.  The incisions will be closed with stitches (sutures).  A bandage (dressing) will be placed over the incisions. The procedure may vary among health care providers and hospitals. What happens after the procedure?  Your blood pressure, heart rate, breathing rate, and blood oxygen level will be monitored often until the medicines you were   given have worn off.  You will be given medicine to help with pain, nausea, and vomiting as needed. This information is not intended to replace advice given to you by your health care provider. Make sure you discuss any questions you have with your health care provider. Document Released: 12/24/2000 Document Revised: 02/23/2016 Document  Reviewed: 08/28/2015 Elsevier Interactive Patient Education  2017 [employer]  

## 2017-02-07 ENCOUNTER — Encounter (HOSPITAL_COMMUNITY): Payer: Self-pay

## 2017-04-01 NOTE — Patient Instructions (Signed)
Your procedure is scheduled on:  Tuesday, April 09, 2017  Enter through the Main Entrance of [facility] at:  9:30 AM  Pick up the phone at the desk and dial [phone].  Call this number if you have problems the morning of surgery: [phone].  Remember: Do NOT eat food or drink after:  Midnight Monday  Take these medicines the morning of surgery with a SIP OF WATER:  None  Stop ALL herbal medications at this time  Do NOT smoke the day of surgery.  Do NOT wear jewelry (body piercing), metal hair clips/bobby pins, make-up, artifical eyelashes or nail polish. Do NOT wear lotions, powders, or perfumes.  You may wear deodorant. Do NOT shave for 48 hours prior to surgery. Do NOT bring valuables to the hospital. Contacts, dentures, or bridgework may not be worn into surgery.  Have a responsible adult drive you home and stay with you for 24 hours after your procedure  Bring a copy of your healthcare power of attorney and living will documents.   

## 2017-04-02 ENCOUNTER — Inpatient Hospital Stay (HOSPITAL_COMMUNITY)
Admission: RE | Admit: 2017-04-02 | Discharge: 2017-04-02 | Disposition: A | Discharge status: Home / Self Care | Payer: Medicaid Other | Source: Ambulatory Visit

## 2017-04-09 ENCOUNTER — Ambulatory Visit (HOSPITAL_COMMUNITY)
Admission: RE | Admit: 2017-04-09 | Payer: Medicaid Other | Source: Ambulatory Visit | Admitting: Obstetrics and Gynecology

## 2017-04-09 ENCOUNTER — Encounter (HOSPITAL_COMMUNITY): Admission: RE | Payer: Self-pay | Source: Ambulatory Visit

## 2017-04-09 SURGERY — LIGATION, FALLOPIAN TUBE, LAPAROSCOPIC
Anesthesia: Choice | Laterality: Bilateral

## 2017-07-09 ENCOUNTER — Encounter: Payer: Self-pay | Admitting: Medical

## 2017-07-09 ENCOUNTER — Ambulatory Visit (INDEPENDENT_AMBULATORY_CARE_PROVIDER_SITE_OTHER): Payer: BLUE CROSS/BLUE SHIELD | Admitting: Medical

## 2017-07-09 VITALS — BP 116/74 | HR 66 | Ht 68.0 in | Wt 129.2 lb

## 2017-07-09 DIAGNOSIS — M25562 Pain in left knee: Secondary | ICD-10-CM | POA: Diagnosis not present

## 2017-07-09 DIAGNOSIS — M2550 Pain in unspecified joint: Secondary | ICD-10-CM

## 2017-07-09 DIAGNOSIS — M25561 Pain in right knee: Secondary | ICD-10-CM | POA: Diagnosis not present

## 2017-07-09 DIAGNOSIS — M549 Dorsalgia, unspecified: Secondary | ICD-10-CM | POA: Diagnosis not present

## 2017-07-09 DIAGNOSIS — M791 Myalgia, unspecified site: Secondary | ICD-10-CM | POA: Diagnosis not present

## 2017-07-09 DIAGNOSIS — Z23 Encounter for immunization: Secondary | ICD-10-CM

## 2017-07-09 DIAGNOSIS — Z789 Other specified health status: Secondary | ICD-10-CM | POA: Diagnosis not present

## 2017-07-09 DIAGNOSIS — G8929 Other chronic pain: Secondary | ICD-10-CM

## 2017-07-09 DIAGNOSIS — Z0000 Encounter for general adult medical examination without abnormal findings: Secondary | ICD-10-CM | POA: Diagnosis not present

## 2017-07-09 DIAGNOSIS — Z603 Acculturation difficulty: Secondary | ICD-10-CM

## 2017-07-09 LAB — POCT URINALYSIS DIP (PROADVANTAGE DEVICE)
Bilirubin, UA: NEGATIVE
Blood, UA: NEGATIVE
GLUCOSE UA: NEGATIVE mg/dL
Ketones, POC UA: NEGATIVE mg/dL
LEUKOCYTES UA: NEGATIVE
NITRITE UA: NEGATIVE
Protein Ur, POC: NEGATIVE mg/dL
Specific Gravity, Urine: 1.02
Urobilinogen, Ur: NEGATIVE
pH, UA: 6 (ref 5.0–8.0)

## 2017-07-09 NOTE — Addendum Note (Signed)
Addended by: Winn Jock on: 07/09/2017 12:05 PM   Modules accepted: Orders

## 2017-07-09 NOTE — Progress Notes (Signed)
Subjective:   HPI  Chelsea Cook is a 36 y.o. female who presents for a complete physical.  Her husband is here today for physical as well.   Concerns: Low back pain and bilat knee pain for years.   She also notes other pains, right and left chest walls, elbow pain on right, right 1st and 2nd toe MTP pains, right wrist pain.  No prior massage or chiropractic therapy.  Went to PT once.   irregular periods, 1-2 weeks off at times.   Smaller amount of blood, clots at times.   No OCP.  Stretching daily  She notes hx/o chronic constipation, dry poop at times.  Typically has daily BM.   Reviewed their medical, surgical, family, social, medication, and allergy history and updated chart as appropriate.  Past Medical History:  Diagnosis Date  . Chronic back pain    since childbirth, x several years as of 07/2014  . Chronic leg pain    left along with hip and back pain since childbirth as of 07/2014  . Low back pain 11/15/2015  . Medical history non-contributory   . Pelvic pain in female 11/15/2015  . Upper back pain 11/15/2015    Past Surgical History:  Procedure Laterality Date  . CESAREAN SECTION     x3  . CESAREAN SECTION N/A 12/03/2016   Procedure: CESAREAN SECTION;  Surgeon: Hermina Staggers, MD;  Location: [facility]IRTHING SU[facility];  Service: Obstetrics;  Laterality: N/A;    Social History   Social History  . Marital status: Married    Spouse name: N/A  . Number of children: N/A  . Years of education: N/A   Occupational History  . Not on file.   Social History Main Topics  . Smoking status: Never Smoker  . Smokeless tobacco: Never Used  . Alcohol use No  . Drug use: No  . Sexual activity: Yes    Partners: Male   Other Topics Concern  . Not on file   Social History Narrative  . No narrative on file    Family History  Problem Relation Age of Onset  . Diabetes Father   . Cancer Neg Hx   . Heart disease Neg Hx   . Stroke Neg Hx      Current Outpatient Prescriptions:   .  acetaminophen (TYLENOL) 500 MG tablet, Take 500 mg by mouth every 6 (six) hours as needed for moderate pain, fever or headache. Reported on 12/16/2015, Disp: , Rfl:  .  ibuprofen (ADVIL,MOTRIN) 600 MG tablet, Take 1 tablet (600 mg total) by mouth every 6 (six) hours., Disp: 120 tablet, Rfl: 2  No Known Allergies   Review of Systems Constitutional: -fever, -chills, -sweats, -unexpected weight change, -decreased appetite, -fatigue Allergy: -sneezing, -itching, -congestion Dermatology: -changing moles, --rash, -lumps ENT: -runny nose, -ear pain, -sore throat, -hoarseness, -sinus pain, -teeth pain, - ringing in ears, -hearing loss, -nosebleeds Cardiology: -chest pain, -palpitations, -swelling, -difficulty breathing when lying flat, -waking up short of breath Respiratory: -cough, -shortness of breath, -difficulty breathing with exercise or exertion, -wheezing, -coughing up blood Gastroenterology: -abdominal pain, -nausea, -vomiting, -diarrhea, -constipation, -blood in stool, -changes in bowel movement, -difficulty swallowing or eating Hematology: -bleeding, -bruising  Musculoskeletal: +joint aches, +muscle aches, +joint swelling, +back pain, -neck pain, -cramping, -changes in gait Ophthalmology: denies vision changes, eye redness, itching, discharge Urology: -burning with urination, -difficulty urinating, -blood in urine, -urinary frequency, -urgency, -incontinence Neurology: -headache, -weakness, -tingling, -numbness, -memory loss, -falls, -dizziness Psychology: -depressed mood, -agitation, -sleep problems  Objective:   Physical Exam  BP 116/74   Pulse 66   Ht  (1.727 m)   Wt 129 lb 3.2 oz (58.6 kg)   SpO2 99%   BMI 19.64 kg/m   General appearance: alert, no distress, WD/WN, [place] female  Skin: no worrisome lesions HEENT: normocephalic, conjunctiva/corneas normal, sclerae anicteric, PERRLA, EOMi, nares patent, no discharge or erythema, pharynx normal Oral cavity:  MMM, tongue normal, teeth in good repair Neck: supple, no lymphadenopathy, no thyromegaly, no masses, normal ROM Chest: non tender, normal shape and expansion Heart: RRR, normal S1, S2, no murmurs Lungs: CTA bilaterally, no wheezes, rhonchi, or rales Abdomen: +bs, soft, non tender, non distended, no masses, no hepatomegaly, no splenomegaly, no bruits Back: paraspinal region tender throughout, otherwise non tender, normal ROM, no scoliosis Musculoskeletal: knee with mild tenderness along joint line on the left, laxity with valgus stress of left knee, otherwise no swelling, no laxity or deformity, otherwise upper extremities non tender, no obvious deformity, normal ROM throughout, lower extremities non tender, no obvious deformity, normal ROM throughout Extremities: no edema, no cyanosis, no clubbing Pulses: 2+ symmetric, upper and lower extremities, normal cap refill Neurological: alert, oriented x 3, CN2-12 intact, strength normal upper extremities and lower extremities, sensation normal throughout, DTRs 2+ throughout, no cerebellar signs, gait normal Psychiatric: normal affect, behavior normal, pleasant  Breast/gyn/rectal - deferred to gyn   Assessment and Plan :    Encounter Diagnoses  Name Primary?  . Routine general medical examination at a health care facility Yes  . Language barrier   . Chronic back pain, unspecified back location, unspecified back pain laterality   . Chronic pain of both knees   . Polyarthralgia   . Myalgia   . Need for influenza vaccination      Physical exam - discussed healthy lifestyle, diet, exercise, preventative care, vaccinations, and addressed their concerns.  Handout given.  Recommendations: See your eye doctor yearly for routine vision care.  See your dentist yearly for routine dental care including hygiene visits twice yearly.  See your gynecologist yearly for routine gynecological care.  Chronic back pain and knee pain  We are checking some  labs today to rule out thyroid issues, rheumatoid arthritis and muscle inflammation  If these labs are normal, then we can pursue other evaluation  We may either set you up for imaging or I may have you see a specialist  You do have a little bit of abnormal positioning of the patella/knee cap in the left knee, and laxity of the knee ligaments  You may have a condition called fibromyalgia - diffuse muscle pain  I recommend you consider massage therapy.  Try getting a massage every 2 weeks  I also recommend a daily stretching routine  I recommend considering lap swimming at the [facility] as this is very therapeutic for the muscles and good exercise  Counseled on the influenza virus vaccine.  Vaccine information sheet given.  Influenza vaccine given after consent obtained.  Follow-up pending labs

## 2017-07-09 NOTE — Patient Instructions (Signed)
Recommendations: See your eye doctor yearly for routine vision care.  See your dentist yearly for routine dental care including hygiene visits twice yearly.  See your gynecologist yearly for routine gynecological care.  We updated your flu shot today  Chronic back pain and knee pain  We are checking some labs today to rule out thyroid issues, rheumatoid arthritis and muscle inflammation  If these labs are normal, then we can pursue other evaluation  We may either set you up for imaging or I may have you see a specialist  You do have a little bit of abnormal positioning of the patella/knee cap in the left knee, and laxity of the knee ligaments  You may have a condition called fibromyalgia - diffuse muscle pain  I recommend you consider massage therapy.  Try getting a massage every 2 weeks  I also recommend a daily stretching routine  I recommend considering lap swimming at the [facility] as this is very therapeutic for the muscles and good exercise  Massage Therapy:  Sharen Hint [facility] [address] [place] [address] [place], [place] [address] [phone] [email or web address]   [facility] [address] [place], [place] [address] [phone] Therapy:  Sharen Hint [facility] [address] [place] [address] [place], [place] [address] [phone] [email or web address]    Myofascial Pain Syndrome and Fibromyalgia Myofascial pain syndrome and fibromyalgia are both pain disorders. This pain may be felt mainly in your muscles.  Myofascial pain syndrome: ? Always has trigger points or tender points in the muscle that will cause pain when pressed. The pain may come and go. ? Usually affects your neck, upper back, and shoulder areas. The pain often radiates into your arms and hands.  Fibromyalgia: ? Has muscle pains and tenderness that come and go. ? Is often associated with fatigue and sleep  disturbances. ? Has trigger points. ? Tends to be long-lasting (chronic), but is not life-threatening.  Fibromyalgia and myofascial pain are not the same. However, they often occur together. If you have both conditions, each can make the other worse. Both are common and can cause enough pain and fatigue to make day-to-day activities difficult. What are the causes? The exact causes of fibromyalgia and myofascial pain are not known. People with certain gene types may be more likely to develop fibromyalgia. Some factors can be triggers for both conditions, such as:  Spine disorders.  Arthritis.  Severe injury (trauma) and other physical stressors.  Being under a lot of stress.  A medical illness.  What are the signs or symptoms? Fibromyalgia The main symptom of fibromyalgia is widespread pain and tenderness in your muscles. This can vary over time. Pain is sometimes described as stabbing, shooting, or burning. You may have tingling or numbness, too. You may also have sleep problems and fatigue. You may wake up feeling tired and groggy (fibro fog). Other symptoms may include:  Bowel and bladder problems.  Headaches.  Visual problems.  Problems with odors and noises.  Depression or mood changes.  Painful menstrual periods (dysmenorrhea).  Dry skin or eyes.  Myofascial pain syndrome Symptoms of myofascial pain syndrome include:  Tight, ropy bands of muscle.  Uncomfortable sensations in muscular areas, such as: ? Aching. ? Cramping. ? Burning. ? Numbness. ? Tingling. ? Muscle weakness.  Trouble moving certain muscles freely (range of motion).  How is this diagnosed? There are no specific tests to diagnose fibromyalgia or myofascial pain syndrome. Both can be hard to diagnose because  their symptoms are common in many other conditions. Your health care provider may suspect one or both of these conditions based on your symptoms and medical history. Your health care provider  will also do a physical exam. The key to diagnosing fibromyalgia is having pain, fatigue, and other symptoms for more than three months that cannot be explained by another condition. The key to diagnosing myofascial pain syndrome is finding trigger points in muscles that are tender and cause pain elsewhere in your body (referred pain). How is this treated? Treating fibromyalgia and myofascial pain often requires a team of health care providers. This usually starts with your primary provider and a physical therapist. You may also find it helpful to work with alternative health care providers, such as massage therapists or acupuncturists. Treatment for fibromyalgia may include medicines. This may include nonsteroidal anti-inflammatory drugs (NSAIDs), along with other medicines. Treatment for myofascial pain may also include:  NSAIDs.  Cooling and stretching of muscles.  Trigger point injections.  Sound wave (ultrasound) treatments to stimulate muscles.  Follow these instructions at home:  Take medicines only as directed by your health care provider.  Exercise as directed by your health care provider or physical therapist.  Try to avoid stressful situations.  Practice relaxation techniques to control your stress. You may want to try: ? Biofeedback. ? Visual imagery. ? Hypnosis. ? Muscle relaxation. ? Yoga. ? Meditation.  Talk to your health care provider about alternative treatments, such as acupuncture or massage treatment.  Maintain a healthy lifestyle. This includes eating a healthy diet and getting enough sleep.  Consider joining a support group.  Do not do activities that stress or strain your muscles. That includes repetitive motions and heavy lifting. Where to find more information:  National Fibromyalgia Association: www.fmaware.org  Arthritis Foundation: www.arthritis.org  American Chronic Pain Association: GumSearch.nl Contact a health  care provider if:  You have new symptoms.  Your symptoms get worse.  You have side effects from your medicines.  You have trouble sleeping.  Your condition is causing depression or anxiety. This information is not intended to replace advice given to you by your health care provider. Make sure you discuss any questions you have with your health care provider. Document Released: 09/17/2005 Document Revised: 02/23/2016 Document Reviewed: 06/23/2014 Elsevier Interactive Patient Education  [employer].

## 2017-07-12 LAB — COMPREHENSIVE METABOLIC PANEL
AG Ratio: 1.4 (calc) (ref 1.0–2.5)
ALBUMIN MSPROF: 4.3 g/dL (ref 3.6–5.1)
ALT: 17 U/L (ref 6–29)
AST: 22 U/L (ref 10–30)
Alkaline phosphatase (APISO): 90 U/L (ref 33–115)
BUN / CREAT RATIO: 12 (calc) (ref 6–22)
BUN: 6 mg/dL — ABNORMAL LOW (ref 7–25)
CHLORIDE: 104 mmol/L (ref 98–110)
CO2: 23 mmol/L (ref 20–32)
CREATININE: 0.52 mg/dL (ref 0.50–1.10)
Calcium: 9.4 mg/dL (ref 8.6–10.2)
GLOBULIN: 3.1 g/dL (ref 1.9–3.7)
Glucose, Bld: 78 mg/dL (ref 65–99)
POTASSIUM: 4.2 mmol/L (ref 3.5–5.3)
SODIUM: 136 mmol/L (ref 135–146)
Total Bilirubin: 0.4 mg/dL (ref 0.2–1.2)
Total Protein: 7.4 g/dL (ref 6.1–8.1)

## 2017-07-12 LAB — CBC WITH DIFFERENTIAL/PLATELET
BASOS PCT: 0.6 %
Basophils Absolute: 43 cells/uL (ref 0–200)
Eosinophils Absolute: 94 cells/uL (ref 15–500)
Eosinophils Relative: 1.3 %
HCT: 37.4 % (ref 35.0–45.0)
HEMOGLOBIN: 12.1 g/dL (ref 11.7–15.5)
LYMPHS ABS: 2131 {cells}/uL (ref 850–3900)
MCH: 26.5 pg — ABNORMAL LOW (ref 27.0–33.0)
MCHC: 32.4 g/dL (ref 32.0–36.0)
MCV: 82 fL (ref 80.0–100.0)
Monocytes Relative: 6.3 %
NEUTROS ABS: 4478 {cells}/uL (ref 1500–7800)
Neutrophils Relative %: 62.2 %
PLATELETS: 207 10*3/uL (ref 140–400)
RBC: 4.56 10*6/uL (ref 3.80–5.10)
RDW: 13.1 % (ref 11.0–15.0)
TOTAL LYMPHOCYTE: 29.6 %
WBC: 7.2 10*3/uL (ref 3.8–10.8)
WBCMIX: 454 {cells}/uL (ref 200–950)

## 2017-07-12 LAB — CK: Total CK: 33 U/L (ref 29–143)

## 2017-07-12 LAB — TSH: TSH: 0.05 mIU/L — ABNORMAL LOW

## 2017-07-12 LAB — ANTI-NUCLEAR AB-TITER (ANA TITER): ANA Titer 1: 1:40 {titer} — ABNORMAL HIGH

## 2017-07-12 LAB — CYCLIC CITRUL PEPTIDE ANTIBODY, IGG: Cyclic Citrullin Peptide Ab: <16 UNITS

## 2017-07-12 LAB — SEDIMENTATION RATE: SED RATE: 19 mm/h (ref 0–20)

## 2017-07-12 LAB — ANA: Anti Nuclear Antibody(ANA): POSITIVE — AB

## 2017-07-12 LAB — T4, FREE: Free T4: 0.8 ng/dL (ref 0.8–1.8)

## 2017-07-12 LAB — URIC ACID: URIC ACID, SERUM: 6.1 mg/dL (ref 2.5–7.0)

## 2017-07-17 ENCOUNTER — Other Ambulatory Visit: Payer: Self-pay | Admitting: Medical

## 2017-07-17 DIAGNOSIS — R7989 Other specified abnormal findings of blood chemistry: Secondary | ICD-10-CM

## 2017-07-19 ENCOUNTER — Other Ambulatory Visit: Payer: BLUE CROSS/BLUE SHIELD

## 2017-07-19 DIAGNOSIS — R7989 Other specified abnormal findings of blood chemistry: Secondary | ICD-10-CM

## 2017-07-20 LAB — FSH/LH
FSH: 4.6 m[IU]/mL
LH: 3.3 m[IU]/mL

## 2017-07-20 LAB — PROLACTIN: Prolactin: 21.7 ng/mL

## 2017-07-22 ENCOUNTER — Other Ambulatory Visit: Payer: Self-pay | Admitting: Medical

## 2017-07-22 ENCOUNTER — Other Ambulatory Visit: Payer: Self-pay

## 2017-07-22 DIAGNOSIS — R768 Other specified abnormal immunological findings in serum: Secondary | ICD-10-CM

## 2017-07-22 DIAGNOSIS — M791 Myalgia, unspecified site: Secondary | ICD-10-CM

## 2017-07-22 DIAGNOSIS — R7989 Other specified abnormal findings of blood chemistry: Secondary | ICD-10-CM

## 2017-07-30 ENCOUNTER — Other Ambulatory Visit: Payer: Self-pay

## 2017-08-01 ENCOUNTER — Ambulatory Visit
Admission: RE | Admit: 2017-08-01 | Discharge: 2017-08-01 | Disposition: A | Discharge status: Home / Self Care | Payer: BLUE CROSS/BLUE SHIELD | Source: Ambulatory Visit | Attending: Medical | Admitting: Medical

## 2017-08-01 DIAGNOSIS — R7989 Other specified abnormal findings of blood chemistry: Secondary | ICD-10-CM

## 2017-10-17 ENCOUNTER — Telehealth: Payer: Self-pay | Admitting: Obstetrics & Gynecology

## 2017-10-17 NOTE — Telephone Encounter (Signed)
Patient's spouse is calling with questions for Dr.Miller. DPR on file to talk with spouse Paulino RilyRanjit Ringgenberg.

## 2017-10-17 NOTE — Telephone Encounter (Signed)
Spoke with patients spouse "Ranjit", ok per current dpr. Spouse requesting OV to discuss contraceptive, possible tubal ligation. OV scheduled for 10/24/17 at 1:45PM with Dr. Hyacinth MeekerMiller. Advised spouse patient is due for AEX, last pap 12/19/15, will schedule at OV. Advised Dr. Hyacinth MeekerMiller will review, I will return call with any additional recommendations, spouse is agreeable.   Routing to provider for final review. Patient is agreeable to disposition. Will close encounter.

## 2017-10-24 ENCOUNTER — Encounter: Payer: Self-pay | Admitting: Obstetrics & Gynecology

## 2017-10-24 ENCOUNTER — Ambulatory Visit (INDEPENDENT_AMBULATORY_CARE_PROVIDER_SITE_OTHER): Payer: BLUE CROSS/BLUE SHIELD | Admitting: Obstetrics & Gynecology

## 2017-10-24 ENCOUNTER — Telehealth: Payer: Self-pay | Admitting: Obstetrics & Gynecology

## 2017-10-24 VITALS — BP 120/70 | HR 92 | Resp 16 | Wt 140.0 lb

## 2017-10-24 DIAGNOSIS — R768 Other specified abnormal immunological findings in serum: Secondary | ICD-10-CM

## 2017-10-24 DIAGNOSIS — R7989 Other specified abnormal findings of blood chemistry: Secondary | ICD-10-CM | POA: Diagnosis not present

## 2017-10-24 DIAGNOSIS — Z3009 Encounter for other general counseling and advice on contraception: Secondary | ICD-10-CM | POA: Diagnosis not present

## 2017-10-24 DIAGNOSIS — N912 Amenorrhea, unspecified: Secondary | ICD-10-CM | POA: Diagnosis not present

## 2017-10-24 DIAGNOSIS — M2550 Pain in unspecified joint: Secondary | ICD-10-CM | POA: Diagnosis not present

## 2017-10-24 LAB — POCT URINE PREGNANCY: PREG TEST UR: NEGATIVE

## 2017-10-24 NOTE — Telephone Encounter (Signed)
Left message to call Concettina Leth at [phone].  

## 2017-10-24 NOTE — Telephone Encounter (Signed)
Patient spouse is calling regarding his wife that was seen today. No details given, just wants to talk with Dr.Miller's nurse.Okay per DPR to talk with spouse Ranjit.

## 2017-10-24 NOTE — Progress Notes (Signed)
GYNECOLOGY  VISIT  CC:   Discuss permanent sterilization  HPI: 37 y.o. [phone] Married [place] female here for disucssion of permanent sterilization.  Pt does not speak fluent English so translator was used.  Translator used:  ID [id number].  Has three sons.  H/O cesarean section x 3.  Does not want to try again or for a girl.  Completely sure about her decision.  Wanted to do this after her second child but husband disagreed.  He is comfortable with plan now.  Has some other concerns she would like to discuss.  Feels like the last two weeks, she feels like she is going to "come down with something".   Every night she feels like she is going to have a fever.  She has shivers and feels hot.  Then she breaks into a sweat and then feels better.  Having a lot of fatigue associated with this.    Was in [place] in 2017 before last pregnancy.  No other travels.    Had had chronic issues with joint pain for years.  ANA and TSH were abnormal.  Pt was not aware these were abnormal.  Continues to have joint issues.  Was worse with pregnancy.  Pt thinks husband may have been called but she was not.  D/w pt referral to specialist.  Will proceed with referral to rheumatologist.  Have reviewed alternatives for contraception if cost is too high.  Would likely recommend progesterone IUD.Marland Kitchen  Last pap 3/17 negative.  Last pap and HR HPV 12/15.  Both were negative.  GYNECOLOGIC HISTORY: Patient's last menstrual period was 10/09/2017. Contraception: none  Patient Active Problem List   Diagnosis Date Noted  . Polyarthralgia 07/09/2017  . Myalgia 07/09/2017  . Pre-op exam 01/29/2017  . Language barrier 08/16/2016  . Need for influenza vaccination 11/15/2015  . Routine general medical examination at a health care facility 11/15/2015  . Chronic pain of both knees 11/15/2015  . Chronic back pain 11/15/2015    Past Medical History:  Diagnosis Date  . Chronic back pain    since childbirth, x several years as  of 07/2014  . Chronic leg pain    left along with hip and back pain since childbirth as of 07/2014  . Low back pain 11/15/2015  . Medical history non-contributory   . Pelvic pain in female 11/15/2015  . Upper back pain 11/15/2015    Past Surgical History:  Procedure Laterality Date  . CESAREAN SECTION     x3  . CESAREAN SECTION N/A 12/03/2016   Procedure: CESAREAN SECTION;  Surgeon: Hermina Staggers, MD;  Location: [facility]IR[facility]ING SU[facility];  Service: Obstetrics;  Laterality: N/A;    MEDS:   No current outpatient medications on file prior to visit.   No current facility-administered medications on file prior to visit.     ALLERGIES: Patient has no known allergies.  Family History  Problem Relation Age of Onset  . Diabetes Father   . Cancer Neg Hx   . Heart disease Neg Hx   . Stroke Neg Hx     SH:  Married, non smoker  Review of Systems  Constitutional: Positive for fever and malaise/fatigue.  Respiratory: Negative.   Cardiovascular: Negative.   All other systems reviewed and are negative.   PHYSICAL EXAMINATION:    BP 120/70 (BP Location: Right Arm, Patient Position: Sitting, Cuff Size: Normal)   Pulse 92   Resp 16   Wt 140 lb (63.5 kg)   LMP 10/09/2017  Breastfeeding? Yes   BMI 21.29 kg/m     General appearance: alert, cooperative and appears stated age Neck: no adenopathy, supple, symmetrical, trachea midline and thyroid normal to inspection and palpation CV:  Regular rate and rhythm Lungs:  clear to auscultation, no wheezes, rales or rhonchi, symmetric air entry Abdomen: soft, non-tender; bowel sounds normal; no masses,  no organomegaly  Pelvic: not performed  Assessment: Desires permanent sterilization.  Failure rate of 10/1998 reviewed as well as risks of bowel, bladder, ureteral injury, DVT, PE, infection and risk of blood products reviewed. Will repeat TSH today Needs rheumatology referral due to elevated ANA.    ~40 minutes spent with patient >50% of time  was in face to face discussion of above.

## 2017-10-24 NOTE — Progress Notes (Deleted)
poctGYNECOLOGY  VISIT  CC:   ***  HPI: 37 y.o. G5P2123 Married [place] female here for contraception consult.  GYNECOLOGIC HISTORY: Patient's last menstrual period was 10/09/2017. Contraception: condoms  Menopausal hormone therapy: none  Patient Active Problem List   Diagnosis Date Noted  . Polyarthralgia 07/09/2017  . Myalgia 07/09/2017  . Pre-op exam 01/29/2017  . Language barrier 08/16/2016  . Need for influenza vaccination 11/15/2015  . Routine general medical examination at a health care facility 11/15/2015  . Chronic pain of both knees 11/15/2015  . Chronic back pain 11/15/2015    Past Medical History:  Diagnosis Date  . Chronic back pain    since childbirth, x several years as of 07/2014  . Chronic leg pain    left along with hip and back pain since childbirth as of 07/2014  . Low back pain 11/15/2015  . Medical history non-contributory   . Pelvic pain in female 11/15/2015  . Upper back pain 11/15/2015    Past Surgical History:  Procedure Laterality Date  . CESAREAN SECTION     x3  . CESAREAN SECTION N/A 12/03/2016   Procedure: CESAREAN SECTION;  Surgeon: Hermina StaggersMichael L Ervin, MD;  [facility];  Service: Obstetrics;  Laterality: N/A;    MEDS:   No current outpatient medications on file prior to visit.   No current facility-administered medications on file prior to visit.     ALLERGIES: Patient has no known allergies.  Family History  Problem Relation Age of Onset  . Diabetes Father   . Cancer Neg Hx   . Heart disease Neg Hx   . Stroke Neg Hx     SH:  ***  ROS  PHYSICAL EXAMINATION:    BP 120/70 (BP Location: Right Arm, Patient Position: Sitting, Cuff Size: Normal)   Pulse 92   Resp 16   Wt 140 lb (63.5 kg)   LMP 10/09/2017   Breastfeeding? Yes   BMI 21.29 kg/m     General appearance: alert, cooperative and appears stated age Neck: no adenopathy, supple, symmetrical, trachea midline and thyroid {CHL AMB PHY EX THYROID NORM  [phone]::"normal to inspection and palpation"} CV:  {Exam; heart brief:31539} Lungs:  {pe lungs ob:314451::"clear to auscultation, no wheezes, rales or rhonchi, symmetric air entry"} Breasts: {Exam; breast:13139::"normal appearance, no masses or tenderness"} Abdomen: soft, non-tender; bowel sounds normal; no masses,  no organomegaly  Pelvic: External genitalia:  no lesions              Urethra:  normal appearing urethra with no masses, tenderness or lesions              Bartholins and Skenes: normal                 Vagina: normal appearing vagina with normal color and discharge, no lesions              Cervix: {CHL AMB PHY EX CERVIX NORM DEFAULT:[id number]::"no lesions"}              Bimanual Exam:  Uterus:  {CHL AMB PHY EX UTERUS NORM [phone]::"normal size, contour, position, consistency, mobility, non-tender"}              Adnexa: {CHL AMB PHY EX ADNEXA NO MASS DEFAULT:[phone]::"no mass, fullness, tenderness"}              Rectovaginal: {yes no:314532}.  Confirms.              Anus:  normal sphincter tone, no lesions  Chaperone was present for exam.  Assessment: ***  Plan: ***   ~{NUMBERS; -10-45 JOINT ROM:10287} minutes spent with patient >50% of time was in face to face discussion of above.

## 2017-10-25 LAB — CBC WITH DIFFERENTIAL/PLATELET
Basophils Absolute: 0 10*3/uL (ref 0.0–0.2)
Basos: 0 %
EOS (ABSOLUTE): 0.2 10*3/uL (ref 0.0–0.4)
EOS: 2 %
HEMATOCRIT: 35.1 % (ref 34.0–46.6)
HEMOGLOBIN: 11.1 g/dL (ref 11.1–15.9)
Immature Grans (Abs): 0 10*3/uL (ref 0.0–0.1)
Immature Granulocytes: 0 %
LYMPHS ABS: 2.1 10*3/uL (ref 0.7–3.1)
Lymphs: 29 %
MCH: 27.1 pg (ref 26.6–33.0)
MCHC: 31.6 g/dL (ref 31.5–35.7)
MCV: 86 fL (ref 79–97)
MONOCYTES: 9 %
Monocytes Absolute: 0.6 10*3/uL (ref 0.1–0.9)
NEUTROS ABS: 4.5 10*3/uL (ref 1.4–7.0)
Neutrophils: 60 %
Platelets: 285 10*3/uL (ref 150–379)
RBC: 4.09 x10E6/uL (ref 3.77–5.28)
RDW: 14.8 % (ref 12.3–15.4)
WBC: 7.5 10*3/uL (ref 3.4–10.8)

## 2017-10-25 LAB — T4, FREE: FREE T4: 0.85 ng/dL (ref 0.82–1.77)

## 2017-10-25 LAB — TSH: TSH: 12.69 u[IU]/mL — ABNORMAL HIGH (ref 0.450–4.500)

## 2017-10-27 ENCOUNTER — Encounter: Payer: Self-pay | Admitting: Obstetrics & Gynecology

## 2017-10-29 NOTE — Telephone Encounter (Signed)
Call to patient using bengali pacific interpreter ID [phone]. No answer, no option to leave voicemail.

## 2017-10-29 NOTE — Telephone Encounter (Signed)
-----   Message from Jerene BearsMary S Miller, MD sent at 10/27/2017  7:50 AM EST ----- This will need to be called with a translator.  Pt's thyroid function is not normal and TSH is 12.  Needs to start thyroid replacement.  Ok to used sythroid (or generic) 50mcg daily and should have TSH repeated in 3 months.  Also, ok to see PCP about this or specialist if desired.  I will place orders for lab work if she is going to return here for blood work.  Just let me know.  Try to speak with pt and not spouse if possible.

## 2017-11-04 NOTE — Telephone Encounter (Signed)
Call returned using pacific interpreter [phone]#[id number]. Left message to call Noreene LarssonJill, nurse at [facility], [phone].

## 2017-11-12 NOTE — Telephone Encounter (Signed)
Call placed using Pacific Interpreter ID [phone], no answer, left message to call Noreene LarssonJill, Nurse at [facility] at [phone].  No alternate numbers on file

## 2017-11-14 NOTE — Telephone Encounter (Signed)
Dr. Hyacinth MeekerMiller -I have called patient x3 with interpreter, no return call, please advise.

## 2017-11-15 NOTE — Telephone Encounter (Signed)
I know she doesn't speak English but her husband does.  Maybe we should send a letter that indicates we've tried to reach her several times to schedule this surgery and for them to call when they are ready to proceed.  I'll read it and then edit if needed before sending.  Thanks.

## 2017-11-18 NOTE — Telephone Encounter (Signed)
Letter edited.  Ok to send.  Thanks.

## 2017-11-18 NOTE — Telephone Encounter (Signed)
Dr. Hyacinth MeekerMiller, I have been trying to reach patient in regards to abnormal labs, please review pended letter and advise.

## 2017-11-19 NOTE — Telephone Encounter (Signed)
Letter with attached results mailed certified and standard mail. Routing to provider for final review, will close encounter.

## 2017-11-22 ENCOUNTER — Telehealth: Payer: Self-pay | Admitting: Obstetrics & Gynecology

## 2017-11-22 MED ORDER — LEVOTHYROXINE SODIUM 50 MCG PO TABS: 50.0000 ug | ORAL_TABLET | Freq: Every day | ORAL | 0 refills | Status: DC

## 2017-11-22 NOTE — Telephone Encounter (Signed)
Spoke with patient's spouse Ranjit, okay per ROI. Advised of results as seen below. Ranjit verbalizes understanding and will relay this information to the patient. Rx for Synthroid 50 mcg daily #90 0RF sent to pharmacy on file. 3 month lab recheck scheduled for 02/17/2018 at 10 am. Ranjit also states that the patient would like to proceed with surgery scheduling for tubal ligation. Advised will notify Dr.Miller and surgery scheduling. Encounter closed.  ----- Message from Jerene BearsMary S Miller, MD sent at 10/27/2017  7:50 AM EST ----- This will need to be called with a translator.  Pt's thyroid function is not normal and TSH is 12.  Needs to start thyroid replacement.  Ok to used sythroid (or generic) 50mcg daily and should have TSH repeated in 3 months.  Also, ok to see PCP about this or specialist if desired.  I will place orders for lab work if she is going to return here for blood work.  Just let me know.  Try to speak with pt and not spouse if possible.

## 2017-11-22 NOTE — Telephone Encounter (Signed)
Patient's spouse Ranjit calling because she received a letter that the office has been trying to reach her.

## 2017-11-26 ENCOUNTER — Telehealth: Payer: Self-pay | Admitting: Obstetrics & Gynecology

## 2017-11-26 NOTE — Telephone Encounter (Signed)
Patients spouse returning call.

## 2017-11-26 NOTE — Telephone Encounter (Signed)
Call placed to patient to review benefits for a recommended procedure. Left voicemail requesting a return call.   cc: Billie RuddySally Yeakley, RN

## 2017-11-27 NOTE — Telephone Encounter (Signed)
Returned call to patients spouse, Paulino RilyRanjit Grape, he is listed on her [employer] Release Form.  Spoke briefly with spouse regarding benefits. Spouse states he will need to call back tomorrow in order to speak in more details.

## 2017-11-29 NOTE — Telephone Encounter (Signed)
[facility] [id number], Left message to call Noreene LarssonJill at [phone].

## 2017-11-29 NOTE — Telephone Encounter (Signed)
Patient's spouse called stating that she is having some side effects from hormone medication and would like to speak with a nurse. Last seen 10/04/17.

## 2017-12-04 NOTE — Telephone Encounter (Signed)
Lower Synthroid dosage to 25 mcg daily.  Patient may cut her pill in half and take one half pill daily or have a new Rx for Synthroid 25 mcg, #30, RF 2.  Follow up for lab recheck in 3 months as planned by Dr. Hyacinth MeekerMiller.  If symptoms persist, please call office back.  If has fever, needs to be see by PCP.  Cc- Dr. Hyacinth MeekerMiller

## 2017-12-04 NOTE — Telephone Encounter (Signed)
Spoke with patients spouse "Ranjit", ok per dpr. Reports patient started new medication "50 mcg tablet" 10 days ago. Reports headache, neck pain and insomnia since starting medication. Spouse asking for "reduced dose".   Confirmed medication as levothyroxine, is taking daily at least 1 hr before breakfast.   Advised not to take any more medication, will review with covering provider and return call, spouse verbalizes understanding and is agreeable.   Spouse had additional questions regarding benefits, call transferred to [facility]   Dr. Edward JollySilva -please review and advise on levothyroxine?   Cc: Dr. Cleatis PolkaMilller, Harland DingwallSuzy Dixon

## 2017-12-04 NOTE — Telephone Encounter (Signed)
Spoke with spouse, "Ranjit" ok per dpr with Molli Knockraige Nurse Carmelina DaneJill Hamm, RN present, via speaker phone. Explained to spouse, the benefit options with BCBS and Medicaid, for both a tubal ligation and bilateral salpingectomy procedures.  Spouse understands options with BCBS are only available until 12/29/17 as [facility] at that time. Patient understands the coverage options with Medicaid. Patient states he will review with the patient and call back to advise how they would like to proceed.

## 2017-12-04 NOTE — Telephone Encounter (Signed)
Spoke with patients spouse, advised as seen below per Dr. Edward JollySilva. Will continue with current RX she has 90 day supply, cutting pill in half. Lab appt previously scheduled for 02/17/18. Spouse read back instructions and verbalizes understanding.  Routing to provider for final review. Patient is agreeable to disposition. Will close encounter.  Cc: Dr. Hyacinth MeekerMiller

## 2017-12-26 NOTE — Progress Notes (Signed)
Office Visit Note  Patient: Chelsea Cook             Date of Birth: 03/07/1981           MRN: [id number]             PCP: Jac Canavan, PA-C Referring: Jac Canavan, PA-C Visit Date: 01/08/2018 Occupation: @GUAROCC @    Subjective:  Pain in multiple joints.   History of Present Illness: Chelsea Cook is a 37 y.o. female seen in consultation per request of her PCP.  She is accompanied by her husband and child today.  According to the family the patient started having symptoms in 2009 after childbirth.  She had epidural and after that she started having lower back pain.  The lower back pain persist.  In 2012 she had another childbirth with epidural with increased pain in her lower back.  She also started experiencing pain in her C-spine neck, elbows and knee joints which will come and go.  Her symptoms got worse after her last childbirth in 2018 which also required epidural.  She denies any history of joint swelling.  There is no history of rash or rainouts phenomenon.  He is tried over-the-counter medications and also NSAIDs prescribed by her physicians without much relief.  Activities of Daily Living:  Patient reports morning stiffness for 0 minute.   Patient Reports nocturnal pain.  In neck and trapezius area Difficulty dressing/grooming: Denies Difficulty climbing stairs: Reports and knee joints Difficulty getting out of chair: Denies Difficulty using hands for taps, buttons, cutlery, and/or writing: Denies   Review of Systems  Constitutional: Positive for fatigue. Negative for night sweats, weight gain and weight loss.  HENT: Negative for mouth sores, trouble swallowing, trouble swallowing, mouth dryness and nose dryness.   Eyes: Negative for pain, redness, visual disturbance and dryness.  Respiratory: Negative for cough, shortness of breath and difficulty breathing.   Cardiovascular: Negative for chest pain, palpitations, hypertension, irregular heartbeat and swelling in  legs/feet.  Gastrointestinal: Negative for blood in stool, constipation and diarrhea.  Endocrine: Negative for increased urination.  Genitourinary: Negative for vaginal dryness.  Musculoskeletal: Positive for arthralgias and joint pain. Negative for joint swelling, myalgias, muscle weakness, morning stiffness, muscle tenderness and myalgias.  Skin: Negative for color change, rash, hair loss, skin tightness, ulcers and sensitivity to sunlight.  Allergic/Immunologic: Negative for susceptible to infections.  Neurological: Negative for dizziness, memory loss, night sweats and weakness.  Hematological: Negative for swollen glands.  Psychiatric/Behavioral: Positive for depressed mood and sleep disturbance. The patient is nervous/anxious.     PMFS History:  Patient Active Problem List   Diagnosis Date Noted  . Polyarthralgia 07/09/2017  . Myalgia 07/09/2017  . Pre-op exam 01/29/2017  . Language barrier 08/16/2016  . Need for influenza vaccination 11/15/2015  . Routine general medical examination at a health care facility 11/15/2015  . Chronic pain of both knees 11/15/2015  . Chronic back pain 11/15/2015    Past Medical History:  Diagnosis Date  . Chronic back pain    since childbirth, x several years as of 07/2014  . Chronic leg pain    left along with hip and back pain since childbirth as of 07/2014  . Low back pain 11/15/2015  . Medical history non-contributory   . Pelvic pain in female 11/15/2015  . Upper back pain 11/15/2015    Family History  Problem Relation Age of Onset  . Diabetes Father   . Healthy Sister   .  Healthy Brother   . Healthy Son   . Healthy Son   . Healthy Son   . Cancer Neg Hx   . Heart disease Neg Hx   . Stroke Neg Hx    Past Surgical History:  Procedure Laterality Date  . CESAREAN SECTION     x3  . CESAREAN SECTION N/A 12/03/2016   Procedure: CESAREAN SECTION;  Surgeon: Hermina Staggers, MD;  Location: [facility] BIR[facility]ING SU[facility];  Service: Obstetrics;   Laterality: N/A;   Social History   Social History Narrative  . Not on file     Objective: Vital Signs: BP 117/82 (BP Location: Right Arm, Patient Position: Sitting, Cuff Size: Normal)   Pulse 69   Resp 15   Ht 5' (1.524 m)   Wt 138 lb (62.6 kg)   BMI 26.95 kg/m    Physical Exam  Constitutional: She is oriented to person, place, and time. She appears well-developed and well-nourished.  HENT:  Head: Normocephalic and atraumatic.  Eyes: Conjunctivae and EOM are normal.  Neck: Normal range of motion.  Cardiovascular: Normal rate, regular rhythm, normal heart sounds and intact distal pulses.  Pulmonary/Chest: Effort normal and breath sounds normal.  Abdominal: Soft. Bowel sounds are normal.  Lymphadenopathy:    She has no cervical adenopathy.  Neurological: She is alert and oriented to person, place, and time.  Skin: Skin is warm and dry. Capillary refill takes less than 2 seconds.  Psychiatric: She has a normal mood and affect. Her behavior is normal.  Nursing note and vitals reviewed.    Musculoskeletal Exam: C-spine and thoracic spine good range of motion.  She has discomfort with range of motion of her lumbar spine which she describes in the lower lumbar paraspinal region.  No SI joint tenderness was noted.  Shoulder joints, elbow joints, wrist joints, MCPs PIPs and DIPs were in good range of motion with no synovitis.  She has tenderness on palpation over right trochanteric bursa.  Hip joints, knee joints, ankles MTPs PIPs were in good range of motion with no synovitis.  CDAI Exam: No CDAI exam completed.    Investigation: No additional findings.  Component     Latest Ref Rng & Units 07/09/2017  ANA Pattern 1      HOMOGENEOUS (A)  ANA Titer 1     titer 1:40 (H)  CK Total     29 - 143 U/L 33  Sed Rate     0 - 20 mm/h 19  Uric Acid, Serum     2.5 - 7.0 mg/dL 6.1  Anit Nuclear Antibody(ANA)     NEGATIVE POSITIVE (A)  Cyclic Citrullin Peptide Ab     UNITS <16    CBC Latest Ref Rng & Units 10/24/2017 07/09/2017 12/04/2016  WBC 3.4 - 10.8 x10E3/uL 7.5 7.2 15.1(H)  Hemoglobin 11.1 - 15.9 g/dL 96.0 45.4 0.9(W)  Hematocrit 34.0 - 46.6 % 35.1 37.4 25.9(L)  Platelets 150 - 379 x10E3/uL 285 207 225   CMP Latest Ref Rng & Units 07/09/2017 11/15/2015 12/06/2013  Glucose 65 - 99 mg/dL 78 81 119(J)  BUN 7 - 25 mg/dL 6(L) 8 7  Creatinine 4.78 - 1.10 mg/dL 2.95 6.21 3.08  Sodium 135 - 146 mmol/L 136 137 137  Potassium 3.5 - 5.3 mmol/L 4.2 4.7 3.8  Chloride 98 - 110 mmol/L 104 104 99  CO2 20 - 32 mmol/L 23 29 25   Calcium 8.6 - 10.2 mg/dL 9.4 9.6 9.4  Total Protein 6.1 - 8.1 g/dL 7.4  7.3 7.4  Total Bilirubin 0.2 - 1.2 mg/dL 0.4 0.4 0.3  Alkaline Phos 33 - 115 U/L - 54 50  AST 10 - 30 U/L 22 19 16   ALT 6 - 29 U/L 17 17 12      Imaging: Xr Cervical Spine 2 Or 3 Views  Result Date: 01/08/2018 There is narrowing was noted.  No facet joint arthropathy was noted. Impression: Unremarkable x-ray of the C-spine  Xr Lumbar Spine 2-3 Views  Result Date: 01/08/2018 No disc space narrowing was noted.  No facet joint arthropathy was noted. Impression unremarkable x-ray of the lumbar spine.   Speciality Comments: No specialty comments available.    Procedures:  No procedures performed Allergies: Patient has no known allergies.   Assessment / Plan:     Visit Diagnoses:   Myalgia: Patient complains of pain in her different muscles which moves around.  She had no muscular weakness.  She most likely have myofascial pain syndrome.  She does not get enough sleep at night as she has been up with her youngest child.  She also has some internal depression and anxiety.  Good sleep hygiene was discussed.  She may also consider possible anti-depressant like Cymbalta which may help with the myofascial pain syndrome.  We advised her to discuss further with her PCP.  Chronic insomnia: Patient has not been sleeping well due to taking care of her children.  Good sleep hygiene was  discussed at length.  Fatigue, related to insomnia.  Good sleep hygiene and regular exercise was discussed.  Neck pain - Plan: XR Cervical Spine 2 or 3 views.  She had good range of motion of her C-spine.  She had some trapezius spasm.  I have given her a handout on neck exercises.  Trochanteric bursitis, right hip: She had tenderness on palpation of her right trochanteric bursa.  Handout on trochanteric bursa exercises was given.  I offered trochanteric bursa injection but she declined.  Chronic bilateral low back pain without sciatica - Plan: XR Lumbar Spine 2-3 Views.  Her pain appears to be muscular.  She has been lifting her children which could be causing stress on her back.  Core muscle strengthening exercises were discussed and handout was given.  Positive ANA (antinuclear antibody) - 1:40 Homogeneous.  ANA titer is not significant.  She has no clinical features of autoimmune disease.  Language barrier  History of hypothyroidism    Orders: Orders Placed This Encounter  Procedures  . XR Cervical Spine 2 or 3 views  . XR Lumbar Spine 2-3 Views   No orders of the defined types were placed in this encounter.   Face-to-face time spent with patient was 50 minutes.  Greater than 50% of time was spent in counseling and coordination of care.  Follow-Up Instructions: Return if symptoms worsen or fail to improve, for myalgia.   Pollyann SavoyShaili Jennalee Greaves, MD  Note - This record has been created using [employer]agon software.  Chart creation errors have been sought, but may not always  have been located. Such creation errors do not reflect on  the standard of medical care.

## 2018-01-08 ENCOUNTER — Ambulatory Visit (INDEPENDENT_AMBULATORY_CARE_PROVIDER_SITE_OTHER): Payer: Self-pay

## 2018-01-08 ENCOUNTER — Ambulatory Visit: Payer: Self-pay | Admitting: Rheumatology

## 2018-01-08 ENCOUNTER — Encounter: Payer: Self-pay | Admitting: Rheumatology

## 2018-01-08 VITALS — BP 117/82 | HR 69 | Resp 15 | Ht 60.0 in | Wt 138.0 lb

## 2018-01-08 DIAGNOSIS — F5102 Adjustment insomnia: Secondary | ICD-10-CM

## 2018-01-08 DIAGNOSIS — R768 Other specified abnormal immunological findings in serum: Secondary | ICD-10-CM

## 2018-01-08 DIAGNOSIS — M545 Low back pain: Secondary | ICD-10-CM

## 2018-01-08 DIAGNOSIS — Z8639 Personal history of other endocrine, nutritional and metabolic disease: Secondary | ICD-10-CM

## 2018-01-08 DIAGNOSIS — M791 Myalgia, unspecified site: Secondary | ICD-10-CM

## 2018-01-08 DIAGNOSIS — M542 Cervicalgia: Secondary | ICD-10-CM

## 2018-01-08 DIAGNOSIS — M7061 Trochanteric bursitis, right hip: Secondary | ICD-10-CM

## 2018-01-08 DIAGNOSIS — R5383 Other fatigue: Secondary | ICD-10-CM

## 2018-01-08 DIAGNOSIS — Z789 Other specified health status: Secondary | ICD-10-CM

## 2018-01-08 DIAGNOSIS — G8929 Other chronic pain: Secondary | ICD-10-CM

## 2018-01-08 NOTE — Patient Instructions (Signed)
Iliotibial Band Syndrome Rehab Ask your health care provider which exercises are safe for you. Do exercises exactly as told by your health care provider and adjust them as directed. It is normal to feel mild stretching, pulling, tightness, or discomfort as you do these exercises, but you should stop right away if you feel sudden pain or your pain gets worse.Do not begin these exercises until told by your health care provider. Stretching and range of motion exercises These exercises warm up your muscles and joints and improve the movement and flexibility of your hip and pelvis. Exercise A: Quadriceps, prone  1. Lie on your abdomen on a firm surface, such as a bed or padded floor. 2. Bend your left / right knee and hold your ankle. If you cannot reach your ankle or pant leg, loop a belt around your foot and grab the belt instead. 3. Gently pull your heel toward your buttocks. Your knee should not slide out to the side. You should feel a stretch in the front of your thigh and knee. 4. Hold this position for __________ seconds. Repeat __________ times. Complete this stretch __________ times a day. Exercise B: Iliotibial band  1. Lie on your side with your left / right leg in the top position. 2. Bend both of your knees and grab your left / right ankle. Stretch out your bottom arm to help you balance. 3. Slowly bring your top knee back so your thigh goes behind your trunk. 4. Slowly lower your top leg toward the floor until you feel a gentle stretch on the outside of your left / right hip and thigh. If you do not feel a stretch and your knee will not fall farther, place the heel of your other foot on top of your knee and pull your knee down toward the floor with your foot. 5. Hold this position for __________ seconds. Repeat __________ times. Complete this stretch __________ times a day. Strengthening exercises These exercises build strength and endurance in your hip and pelvis. Endurance is the  ability to use your muscles for a long time, even after they get tired. Exercise C: Straight leg raises ( hip abductors) 1. Lie on your side with your left / right leg in the top position. Lie so your head, shoulder, knee, and hip line up. You may bend your bottom knee to help you balance. 2. Roll your hips slightly forward so your hips are stacked directly over each other and your left / right knee is facing forward. 3. Tense the muscles in your outer thigh and lift your top leg 4-6 inches (10-15 cm). 4. Hold this position for __________ seconds. 5. Slowly return to the starting position. Let your muscles relax completely before doing another repetition. Repeat __________ times. Complete this exercise __________ times a day. Exercise D: Straight leg raises ( hip extensors) 1. Lie on your abdomen on your bed or a firm surface. You can put a pillow under your hips if that is more comfortable. 2. Bend your left / right knee so your foot is straight up in the air. 3. Squeeze your buttock muscles and lift your left / right thigh off the bed. Do not let your back arch. 4. Tense this muscle as hard as you can without increasing any knee pain. 5. Hold this position for __________ seconds. 6. Slowly lower your leg to the starting position and allow it to relax completely. Repeat __________ times. Complete this exercise __________ times a day. Exercise E: Hip   hike 1. Stand sideways on a bottom step. Stand on your left / right leg with your other foot unsupported next to the step. You can hold onto the railing or wall if needed for balance. 2. Keep your knees straight and your torso square. Then, lift your left / right hip up toward the ceiling. 3. Slowly let your left / right hip lower toward the floor, past the starting position. Your foot should get closer to the floor. Do not lean or bend your knees. Repeat __________ times. Complete this exercise __________ times a day. This information is not  intended to replace advice given to you by your health care provider. Make sure you discuss any questions you have with your health care provider. Document Released: 09/17/2005 Document Revised: 05/22/2016 Document Reviewed: 08/19/2015 Elsevier Interactive Patient Education  2018 [employer]. Back Exercises The following exercises strengthen the muscles that help to support the back. They also help to keep the lower back flexible. Doing these exercises can help to prevent back pain or lessen existing pain. If you have back pain or discomfort, try doing these exercises 2-3 times each day or as told by your health care provider. When the pain goes away, do them once each day, but increase the number of times that you repeat the steps for each exercise (do more repetitions). If you do not have back pain or discomfort, do these exercises once each day or as told by your health care provider. Exercises Single Knee to Chest  Repeat these steps 3-5 times for each leg: 1. Lie on your back on a firm bed or the floor with your legs extended. 2. Bring one knee to your chest. Your other leg should stay extended and in contact with the floor. 3. Hold your knee in place by grabbing your knee or thigh. 4. Pull on your knee until you feel a gentle stretch in your lower back. 5. Hold the stretch for 10-30 seconds. 6. Slowly release and straighten your leg.  Pelvic Tilt  Repeat these steps 5-10 times: 1. Lie on your back on a firm bed or the floor with your legs extended. 2. Bend your knees so they are pointing toward the ceiling and your feet are flat on the floor. 3. Tighten your lower abdominal muscles to press your lower back against the floor. This motion will tilt your pelvis so your tailbone points up toward the ceiling instead of pointing to your feet or the floor. 4. With gentle tension and even breathing, hold this position for 5-10 seconds.  Cat-Cow  Repeat these steps until your lower back  becomes more flexible: 1. Get into a hands-and-knees position on a firm surface. Keep your hands under your shoulders, and keep your knees under your hips. You may place padding under your knees for comfort. 2. Let your head hang down, and point your tailbone toward the floor so your lower back becomes rounded like the back of a cat. 3. Hold this position for 5 seconds. 4. Slowly lift your head and point your tailbone up toward the ceiling so your back forms a sagging arch like the back of a cow. 5. Hold this position for 5 seconds.  Press-Ups  Repeat these steps 5-10 times: 1. Lie on your abdomen (face-down) on the floor. 2. Place your palms near your head, about shoulder-width apart. 3. While you keep your back as relaxed as possible and keep your hips on the floor, slowly straighten your arms to raise the top  half of your body and lift your shoulders. Do not use your back muscles to raise your upper torso. You may adjust the placement of your hands to make yourself more comfortable. 4. Hold this position for 5 seconds while you keep your back relaxed. 5. Slowly return to lying flat on the floor.  Bridges  Repeat these steps 10 times: 1. Lie on your back on a firm surface. 2. Bend your knees so they are pointing toward the ceiling and your feet are flat on the floor. 3. Tighten your buttocks muscles and lift your buttocks off of the floor until your waist is at almost the same height as your knees. You should feel the muscles working in your buttocks and the back of your thighs. If you do not feel these muscles, slide your feet 1-2 inches farther away from your buttocks. 4. Hold this position for 3-5 seconds. 5. Slowly lower your hips to the starting position, and allow your buttocks muscles to relax completely.  If this exercise is too easy, try doing it with your arms crossed over your chest. Abdominal Crunches  Repeat these steps 5-10 times: 1. Lie on your back on a firm bed or the  floor with your legs extended. 2. Bend your knees so they are pointing toward the ceiling and your feet are flat on the floor. 3. Cross your arms over your chest. 4. Tip your chin slightly toward your chest without bending your neck. 5. Tighten your abdominal muscles and slowly raise your trunk (torso) high enough to lift your shoulder blades a tiny bit off of the floor. Avoid raising your torso higher than that, because it can put too much stress on your low back and it does not help to strengthen your abdominal muscles. 6. Slowly return to your starting position.  Back Lifts Repeat these steps 5-10 times: 1. Lie on your abdomen (face-down) with your arms at your sides, and rest your forehead on the floor. 2. Tighten the muscles in your legs and your buttocks. 3. Slowly lift your chest off of the floor while you keep your hips pressed to the floor. Keep the back of your head in line with the curve in your back. Your eyes should be looking at the floor. 4. Hold this position for 3-5 seconds. 5. Slowly return to your starting position.  Contact a health care provider if:  Your back pain or discomfort gets much worse when you do an exercise.  Your back pain or discomfort does not lessen within 2 hours after you exercise. If you have any of these problems, stop doing these exercises right away. Do not do them again unless your health care provider says that you can. Get help right away if:  You develop sudden, severe back pain. If this happens, stop doing the exercises right away. Do not do them again unless your health care provider says that you can. This information is not intended to replace advice given to you by your health care provider. Make sure you discuss any questions you have with your health care provider. Document Released: 10/25/2004 Document Revised: 01/25/2016 Document Reviewed: 11/11/2014 Elsevier Interactive Patient Education  2017 [employer] Cervical Strain and Sprain  Rehab Ask your health care provider which exercises are safe for you. Do exercises exactly as told by your health care provider and adjust them as directed. It is normal to feel mild stretching, pulling, tightness, or discomfort as you do these exercises, but you should stop right away  if you feel sudden pain or your pain gets worse.Do not begin these exercises until told by your health care provider. Stretching and range of motion exercises These exercises warm up your muscles and joints and improve the movement and flexibility of your neck. These exercises also help to relieve pain, numbness, and tingling. Exercise A: Cervical side bend  1. Using good posture, sit on a stable chair or stand up. 2. Without moving your shoulders, slowly tilt your left / right ear to your shoulder until you feel a stretch in your neck muscles. You should be looking straight ahead. 3. Hold for __________ seconds. 4. Repeat with the other side of your neck. Repeat __________ times. Complete this exercise __________ times a day. Exercise B: Cervical rotation  1. Using good posture, sit on a stable chair or stand up. 2. Slowly turn your head to the side as if you are looking over your left / right shoulder. ? Keep your eyes level with the ground. ? Stop when you feel a stretch along the side and the back of your neck. 3. Hold for __________ seconds. 4. Repeat this by turning to your other side. Repeat __________ times. Complete this exercise __________ times a day. Exercise C: Thoracic extension and pectoral stretch 1. Roll a towel or a small blanket so it is about 4 inches (10 cm) in diameter. 2. Lie down on your back on a firm surface. 3. Put the towel lengthwise, under your spine in the middle of your back. It should not be not under your shoulder blades. The towel should line up with your spine from your middle back to your lower back. 4. Put your hands behind your head and let your elbows fall out to your  sides. 5. Hold for __________ seconds. Repeat __________ times. Complete this exercise __________ times a day. Strengthening exercises These exercises build strength and endurance in your neck. Endurance is the ability to use your muscles for a long time, even after your muscles get tired. Exercise D: Upper cervical flexion, isometric 1. Lie on your back with a thin pillow behind your head and a small rolled-up towel under your neck. 2. Gently tuck your chin toward your chest and nod your head down to look toward your feet. Do not lift your head off the pillow. 3. Hold for __________ seconds. 4. Release the tension slowly. Relax your neck muscles completely before you repeat this exercise. Repeat __________ times. Complete this exercise __________ times a day. Exercise E: Cervical extension, isometric  1. Stand about 6 inches (15 cm) away from a wall, with your back facing the wall. 2. Place a soft object, about 6-8 inches (15-20 cm) in diameter, between the back of your head and the wall. A soft object could be a small pillow, a ball, or a folded towel. 3. Gently tilt your head back and press into the soft object. Keep your jaw and forehead relaxed. 4. Hold for __________ seconds. 5. Release the tension slowly. Relax your neck muscles completely before you repeat this exercise. Repeat __________ times. Complete this exercise __________ times a day. Posture and body mechanics  Body mechanics refers to the movements and positions of your body while you do your daily activities. Posture is part of body mechanics. Good posture and healthy body mechanics can help to relieve stress in your body's tissues and joints. Good posture means that your spine is in its natural S-curve position (your spine is neutral), your shoulders are pulled back slightly,  and your head is not tipped forward. The following are general guidelines for applying improved posture and body mechanics to your everyday  activities. Standing  When standing, keep your spine neutral and keep your feet about hip-width apart. Keep a slight bend in your knees. Your ears, shoulders, and hips should line up.  When you do a task in which you stand in one place for a long time, place one foot up on a stable object that is 2-4 inches (5-10 cm) high, such as a footstool. This helps keep your spine neutral. Sitting   When sitting, keep your spine neutral and your keep feet flat on the floor. Use a footrest, if necessary, and keep your thighs parallel to the floor. Avoid rounding your shoulders, and avoid tilting your head forward.  When working at a desk or a computer, keep your desk at a height where your hands are slightly lower than your elbows. Slide your chair under your desk so you are close enough to maintain good posture.  When working at a computer, place your monitor at a height where you are looking straight ahead and you do not have to tilt your head forward or downward to look at the screen. Resting When lying down and resting, avoid positions that are most painful for you. Try to support your neck in a neutral position. You can use a contour pillow or a small rolled-up towel. Your pillow should support your neck but not push on it. This information is not intended to replace advice given to you by your health care provider. Make sure you discuss any questions you have with your health care provider. Document Released: 09/17/2005 Document Revised: 05/24/2016 Document Reviewed: 08/24/2015 Elsevier Interactive Patient Education  [employer].

## 2018-02-07 ENCOUNTER — Ambulatory Visit: Payer: BLUE CROSS/BLUE SHIELD | Admitting: Rheumatology

## 2018-02-07 ENCOUNTER — Encounter: Payer: Self-pay | Admitting: Rheumatology

## 2018-02-07 VITALS — BP 128/81 | HR 74 | Resp 12 | Ht 60.0 in | Wt 136.0 lb

## 2018-02-07 DIAGNOSIS — M7061 Trochanteric bursitis, right hip: Secondary | ICD-10-CM

## 2018-02-07 DIAGNOSIS — M545 Low back pain, unspecified: Secondary | ICD-10-CM

## 2018-02-07 DIAGNOSIS — M7918 Myalgia, other site: Secondary | ICD-10-CM

## 2018-02-07 DIAGNOSIS — N921 Excessive and frequent menstruation with irregular cycle: Secondary | ICD-10-CM

## 2018-02-07 DIAGNOSIS — M542 Cervicalgia: Secondary | ICD-10-CM

## 2018-02-07 DIAGNOSIS — G8929 Other chronic pain: Secondary | ICD-10-CM

## 2018-02-07 NOTE — Progress Notes (Signed)
Office Visit Note  Patient: Chelsea Cook             Date of Birth: 1980/11/28           MRN: [id number]             PCP: Jac Canavan, PA-C Referring: Jac Canavan, PA-C Visit Date: 02/07/2018 Occupation: @    Subjective:  Right hip pain.   History of Present Illness: Chelsea Cook is a 37 y.o. female with history of chronic insomnia myofascial pain and lower back discomfort.  She states she has been doing some exercises at home.  She has not been to physical therapy or exercising at gym.  She has noticed some improvement in her neck and lower back.  She has been having some discomfort in her left hip.  She states she that she is up at night with the baby and not getting restful sleep.  She also is under a lot of stress due to not sleeping well and working during the day.  She gives a history of increasing stress.  Patient reports that she has been having infrequent periods and she is not on any oral contraceptive pills.  Activities of Daily Living:  Patient reports morning stiffness for 0 minute.   Patient Reports nocturnal pain.  Right hip and lower back Difficulty dressing/grooming: Denies Difficulty climbing stairs: Denies Difficulty getting out of chair: Denies Difficulty using hands for taps, buttons, cutlery, and/or writing: Denies   Review of Systems  Constitutional: Negative for fatigue, night sweats, weight gain and weight loss.  HENT: Negative for mouth sores, trouble swallowing, trouble swallowing, mouth dryness and nose dryness.   Eyes: Negative for pain, redness, visual disturbance and dryness.  Respiratory: Negative for cough, shortness of breath and difficulty breathing.   Cardiovascular: Negative for chest pain, palpitations, hypertension, irregular heartbeat and swelling in legs/feet.  Gastrointestinal: Negative for blood in stool, constipation and diarrhea.  Endocrine: Negative for increased urination.  Genitourinary: Negative for vaginal dryness.   Musculoskeletal: Positive for myalgias and myalgias. Negative for arthralgias, joint pain, joint swelling, muscle weakness, morning stiffness and muscle tenderness.  Skin: Negative for color change, rash, hair loss, skin tightness, ulcers and sensitivity to sunlight.  Allergic/Immunologic: Negative for susceptible to infections.  Neurological: Negative for dizziness, memory loss, night sweats and weakness.  Hematological: Negative for swollen glands.  Psychiatric/Behavioral: Negative for depressed mood and sleep disturbance. The patient is not nervous/anxious.     PMFS History:  Patient Active Problem List   Diagnosis Date Noted  . Polyarthralgia 07/09/2017  . Myalgia 07/09/2017  . Pre-op exam 01/29/2017  . Language barrier 08/16/2016  . Need for influenza vaccination 11/15/2015  . Routine general medical examination at a health care facility 11/15/2015  . Chronic pain of both knees 11/15/2015  . Chronic back pain 11/15/2015    Past Medical History:  Diagnosis Date  . Chronic back pain    since childbirth, x several years as of 07/2014  . Chronic leg pain    left along with hip and back pain since childbirth as of 07/2014  . Low back pain 11/15/2015  . Medical history non-contributory   . Pelvic pain in female 11/15/2015  . Upper back pain 11/15/2015    Family History  Problem Relation Age of Onset  . Diabetes Father   . Healthy Sister   . Healthy Brother   . Healthy Son   . Healthy Son   . Healthy Son   .  Cancer Neg Hx   . Heart disease Neg Hx   . Stroke Neg Hx    Past Surgical History:  Procedure Laterality Date  . CESAREAN SECTION     x3  . CESAREAN SECTION N/A 12/03/2016   Procedure: CESAREAN SECTION;  Surgeon: Hermina Staggers, MD;  Location: [facility];  Service: Obstetrics;  Laterality: N/A;   Social History   Social History Narrative  . Not on file     Objective: Vital Signs: BP 128/81 (BP Location: Left Arm, Patient Position: Sitting, Cuff Size:  Normal)   Pulse 74   Resp 12   Ht 5' (1.524 m)   Wt 136 lb (61.7 kg)   BMI 26.56 kg/m    Physical Exam  Constitutional: She is oriented to person, place, and time. She appears well-developed and well-nourished.  HENT:  Head: Normocephalic and atraumatic.  Eyes: Conjunctivae and EOM are normal.  Neck: Normal range of motion.  Cardiovascular: Normal rate, regular rhythm, normal heart sounds and intact distal pulses.  Pulmonary/Chest: Effort normal and breath sounds normal.  Abdominal: Soft. Bowel sounds are normal.  Lymphadenopathy:    She has no cervical adenopathy.  Neurological: She is alert and oriented to person, place, and time.  Skin: Skin is warm and dry. Capillary refill takes less than 2 seconds.  Psychiatric: She has a normal mood and affect. Her behavior is normal.  Nursing note and vitals reviewed.    Musculoskeletal Exam: C-spine thoracic lumbar spine good range of motion.  She has discomfort range of motion of her C-spine and lumbar spine.  She has some trapezius spasm.  Shoulder joints elbow joints wrist joint MCPs PIPs DIPs were in good range of motion with no synovitis.  Hip joints knee joints ankles MTPs PIPs been good range of motion with no synovitis.  CDAI Exam: No CDAI exam completed.    Investigation: No additional findings.   Imaging: No results found.  Speciality Comments: No specialty comments available.    Procedures:  No procedures performed Allergies: Patient has no known allergies.   Assessment / Plan:     Visit Diagnoses: Myofascial pain-she continues to have some muscle pain and generalized pain.  She states she is not getting enough sleep which is causing a lot of discomfort and fatigue.  Chronic midline low back pain without sciatica-she has chronic discomfort from lifting her baby.  Neck pain-she relates neck muscle spasm and discomfort from lifting child as well.  Trochanteric bursitis of right hip-IT band exercises were  discussed.  Menorrhagia with irregular cycle -I will refer her to GYN.   Orders: No orders of the defined types were placed in this encounter.  No orders of the defined types were placed in this encounter.    Follow-Up Instructions: Return if symptoms worsen or fail to improve.   Pollyann Savoy, MD  Note - This record has been created using [employer].  Chart creation errors have been sought, but may not always  have been located. Such creation errors do not reflect on  the standard of medical care.

## 2018-02-07 NOTE — Addendum Note (Signed)
Addended by: Geroge Baseman C on: 02/07/2018 11:37 AM   Modules accepted: Orders

## 2018-02-12 ENCOUNTER — Telehealth: Payer: Self-pay | Admitting: Obstetrics & Gynecology

## 2018-02-12 NOTE — Telephone Encounter (Signed)
Called and left a message for patient to call back to schedule an existing patient doctor referral appointment with our office to see Dr. Hyacinth Meeker for: Infrequent periods.  Also returned call to [facility] at Dr. Fatima Sanger office and left a message.

## 2018-02-12 NOTE — Telephone Encounter (Signed)
Sherry from Dr. Fatima Sanger office left voicemail to confirm that patient had been scheduled and would like to follow up about referral.

## 2018-02-17 ENCOUNTER — Other Ambulatory Visit (INDEPENDENT_AMBULATORY_CARE_PROVIDER_SITE_OTHER): Payer: Medicaid Other

## 2018-02-17 DIAGNOSIS — R7989 Other specified abnormal findings of blood chemistry: Secondary | ICD-10-CM

## 2018-02-17 DIAGNOSIS — Z0000 Encounter for general adult medical examination without abnormal findings: Secondary | ICD-10-CM

## 2018-02-18 LAB — TSH: TSH: 3.71 u[IU]/mL (ref 0.450–4.500)

## 2018-02-20 NOTE — Telephone Encounter (Signed)
Called and spoke with patient's spouse, okay per DPR on file, and scheduled patient with Dr. Hyacinth Meeker on 02/21/18 at 9:00 AM.   Routing to provider for FYI.

## 2018-02-21 ENCOUNTER — Ambulatory Visit: Payer: Medicaid Other | Admitting: Obstetrics & Gynecology

## 2018-02-21 NOTE — Progress Notes (Deleted)
GYNECOLOGY  VISIT  CC:   ***  HPI: 37 y.o. G5P2123 Married [place] female here for infrequent menses.  GYNECOLOGIC HISTORY: No LMP recorded. Contraception: *** Menopausal hormone therapy: ***  Patient Active Problem List   Diagnosis Date Noted  . Polyarthralgia 07/09/2017  . Myalgia 07/09/2017  . Pre-op exam 01/29/2017  . Language barrier 08/16/2016  . Need for influenza vaccination 11/15/2015  . Routine general medical examination at a health care facility 11/15/2015  . Chronic pain of both knees 11/15/2015  . Chronic back pain 11/15/2015    Past Medical History:  Diagnosis Date  . Chronic back pain    since childbirth, x several years as of 07/2014  . Chronic leg pain    left along with hip and back pain since childbirth as of 07/2014  . Low back pain 11/15/2015  . Medical history non-contributory   . Pelvic pain in female 11/15/2015  . Upper back pain 11/15/2015    Past Surgical History:  Procedure Laterality Date  . CESAREAN SECTION     x3  . CESAREAN SECTION N/A 12/03/2016   Procedure: CESAREAN SECTION;  Surgeon: Hermina Staggers, MD;  Location: [facility] BIRTHING SUITES;  Service: Obstetrics;  Laterality: N/A;    MEDS:   Current Outpatient Medications on File Prior to Visit  Medication Sig Dispense Refill  . levothyroxine (SYNTHROID) 50 MCG tablet Take 1 tablet (50 mcg total) by mouth daily before breakfast. 90 tablet 0   No current facility-administered medications on file prior to visit.     ALLERGIES: Patient has no known allergies.  Family History  Problem Relation Age of Onset  . Diabetes Father   . Healthy Sister   . Healthy Brother   . Healthy Son   . Healthy Son   . Healthy Son   . Cancer Neg Hx   . Heart disease Neg Hx   . Stroke Neg Hx     SH:  ***  ROS  PHYSICAL EXAMINATION:    There were no vitals taken for this visit.    General appearance: alert, cooperative and appears stated age Neck: no adenopathy, supple, symmetrical, trachea  midline and thyroid {CHL AMB PHY EX THYROID NORM DEFAULT:[phone]::"normal to inspection and palpation"} CV:  {Exam; heart brief:31539} Lungs:  {pe lungs ob:314451::"clear to auscultation, no wheezes, rales or rhonchi, symmetric air entry"} Breasts: {Exam; breast:13139::"normal appearance, no masses or tenderness"} Abdomen: soft, non-tender; bowel sounds normal; no masses,  no organomegaly  Pelvic: External genitalia:  no lesions              Urethra:  normal appearing urethra with no masses, tenderness or lesions              Bartholins and Skenes: normal                 Vagina: normal appearing vagina with normal color and discharge, no lesions              Cervix: {CHL AMB PHY EX CERVIX NORM DEFAULT:[phone]::"no lesions"}              Bimanual Exam:  Uterus:  {CHL AMB PHY EX UTERUS NORM DEFAULT:[phone]::"normal size, contour, position, consistency, mobility, non-tender"}              Adnexa: {CHL AMB PHY EX ADNEXA NO MASS [phone]::"no mass, fullness, tenderness"}              Rectovaginal: {yes no:314532}.  Confirms.  Anus:  normal sphincter tone, no lesions  Chaperone was present for exam.  Assessment: ***  Plan: ***   ~{NUMBERS; -10-45 JOINT ROM:10287} minutes spent with patient >50% of time was in face to face discussion of above.

## 2018-02-24 ENCOUNTER — Other Ambulatory Visit: Payer: Self-pay | Admitting: Obstetrics & Gynecology

## 2018-02-24 MED ORDER — LEVOTHYROXINE SODIUM 50 MCG PO TABS: 50.0000 ug | ORAL_TABLET | Freq: Every day | ORAL | 3 refills | Status: AC

## 2018-03-04 ENCOUNTER — Telehealth: Payer: Self-pay | Admitting: Obstetrics & Gynecology

## 2018-03-04 ENCOUNTER — Ambulatory Visit: Payer: Medicaid Other | Admitting: Obstetrics & Gynecology

## 2018-03-04 NOTE — Telephone Encounter (Signed)
Patient cancelled appointment for today. See staff message. °

## 2018-04-07 ENCOUNTER — Telehealth: Payer: Self-pay | Admitting: Obstetrics & Gynecology

## 2018-04-07 NOTE — Telephone Encounter (Signed)
Called and left a message for patient to call back to schedule an existing patient doctor referral appointment with Dr. Hyacinth MeekerMiller for: history of menorrhagia with irregular periods.  Routing to triage for FYI only

## 2018-04-10 ENCOUNTER — Telehealth: Payer: Self-pay | Admitting: Obstetrics & Gynecology

## 2018-04-10 NOTE — Telephone Encounter (Signed)
Called and left patient a message to call back and ask for Starla or Sue Lushndrea in regards to her appointment tomorrow.

## 2018-04-11 ENCOUNTER — Ambulatory Visit: Payer: Medicaid Other | Admitting: Obstetrics & Gynecology

## 2018-04-18 ENCOUNTER — Telehealth: Payer: Self-pay | Admitting: Obstetrics & Gynecology

## 2018-04-18 NOTE — Telephone Encounter (Signed)
Patient has a question for the nurse. Please call patient's cell number [phone].

## 2018-04-18 NOTE — Telephone Encounter (Signed)
Call to patient cell number listed. No message left as nit listed on DPR. Call to patient original number, female states to call patient directly.

## 2018-04-18 NOTE — Telephone Encounter (Signed)
2nd attempt to call patient cell number. Left message stating "doctors office returning her call. You may call back at your convenience."  No personal or identifying information left.

## 2018-04-29 NOTE — Telephone Encounter (Signed)
Dr.Miller, okay to close encounter? Patient has not returned calls x 2.

## 2018-04-30 NOTE — Telephone Encounter (Signed)
Ok to close encounter.  Thanks. 

## 2018-05-09 ENCOUNTER — Other Ambulatory Visit: Payer: Self-pay

## 2018-05-09 ENCOUNTER — Ambulatory Visit: Payer: BLUE CROSS/BLUE SHIELD | Admitting: Obstetrics & Gynecology

## 2018-05-09 ENCOUNTER — Encounter: Payer: Self-pay | Admitting: Obstetrics & Gynecology

## 2018-05-09 VITALS — BP 108/70 | HR 76 | Resp 16 | Ht 60.0 in | Wt 124.6 lb

## 2018-05-09 DIAGNOSIS — N926 Irregular menstruation, unspecified: Secondary | ICD-10-CM | POA: Diagnosis not present

## 2018-05-09 DIAGNOSIS — R1032 Left lower quadrant pain: Secondary | ICD-10-CM

## 2018-05-09 DIAGNOSIS — G8929 Other chronic pain: Secondary | ICD-10-CM

## 2018-05-09 DIAGNOSIS — M545 Low back pain: Secondary | ICD-10-CM

## 2018-05-09 LAB — POCT URINE PREGNANCY: Preg Test, Ur: NEGATIVE

## 2018-05-09 NOTE — Progress Notes (Signed)
GYNECOLOGY  VISIT  CC:   Cycles about every two weeks and abdominal pain  HPI: 37 y.o. [id number] Married [place] female here for abdominal pain x >2 months.  She has been having menstrual bleeding about every two months.  She has been on OCPs in the past.  States she did miss OCPs in the past.  Initially states this is something she is interested in taking but then states she didn't like having to be on a pill in the past and having to take it regularly.  She did miss pills in the past.  She was also interested earlier this year in having a bilateral tubal ligation.  My office was then unable to get her or her husband to return phone calls to schedule procedure.  I discussed this with her today and she is desirous of proceeding with tubal ligation as she does not desire future child bearing.  States she is concerned about there being an "infection present".  Also, reports she is having LLQ pain as well as left lower back pain.  This has been a long term issue for her.  She did go to physical therapy once but did not return.  She has also seen Dr. Corliss Skainseveshwar who has diagnosed the patient with myofascial pain.  I did use a translator for the visit today.  GYNECOLOGIC HISTORY: Patient's last menstrual period was 04/23/2018 (exact date). Contraception: condoms every time  Menopausal hormone therapy: none  Patient Active Problem List   Diagnosis Date Noted  . Polyarthralgia 07/09/2017  . Myalgia 07/09/2017  . Pre-op exam 01/29/2017  . Language barrier 08/16/2016  . Need for influenza vaccination 11/15/2015  . Routine general medical examination at a health care facility 11/15/2015  . Chronic pain of both knees 11/15/2015  . Chronic back pain 11/15/2015    Past Medical History:  Diagnosis Date  . Chronic back pain    since childbirth, x several years as of 07/2014  . Chronic leg pain    left along with hip and back pain since childbirth as of 07/2014  . Low back pain 11/15/2015  .  Medical history non-contributory   . Pelvic pain in female 11/15/2015  . Upper back pain 11/15/2015    Past Surgical History:  Procedure Laterality Date  . CESAREAN SECTION     x3  . CESAREAN SECTION N/A 12/03/2016   Procedure: CESAREAN SECTION;  Surgeon: Hermina StaggersMichael L Ervin, MD;  [facility];  Service: Obstetrics;  Laterality: N/A;    MEDS:   Current Outpatient Medications on File Prior to Visit  Medication Sig Dispense Refill  . levothyroxine (SYNTHROID) 50 MCG tablet Take 1 tablet (50 mcg total) by mouth daily before breakfast. 90 tablet 3  . Multiple Vitamin (MULTIVITAMIN) tablet Take 1 tablet by mouth daily.     No current facility-administered medications on file prior to visit.     ALLERGIES: Patient has no known allergies.  Family History  Problem Relation Age of Onset  . Liver cancer Mother [id number]  . Diabetes Father   . Healthy Sister   . Healthy Brother   . Healthy Son   . Healthy Son   . Healthy Son   . Cancer Neg Hx   . Heart disease Neg Hx   . Stroke Neg Hx     SH:  Married, non smoker  Review of Systems  Musculoskeletal: Positive for myalgias.  Neurological: Positive for headaches.  All other systems reviewed and are negative.   PHYSICAL  EXAMINATION:    BP 108/70 (BP Location: Right Arm, Patient Position: Sitting, Cuff Size: Normal)   Pulse 76   Resp 16   Ht 5' (1.524 m)   Wt 124 lb 9.6 oz (56.5 kg)   LMP 04/23/2018 (Exact Date)   BMI 24.33 kg/m     General appearance: alert, cooperative and appears stated age No exam was performed today.  Chaperone was present.  Assessment: Irregular bleeding H/O cesarean section x 3 H/O bilateral tubal ligation  Plan: Pt is going to return for PUS and further discussion of treatment options as well as discussion of tubal ligation.  She provided alternate contact information today as well. CBC, CMP, TSH obtained today   ~15 minutes spent with patient >50% of time was in face to face discussion  of above.

## 2018-05-09 NOTE — Progress Notes (Signed)
Patient scheduled for Pel[facility]. 05/16/18 at 1200. Printed instructions given and discussed with interpretor # [id number].

## 2018-05-10 LAB — COMPREHENSIVE METABOLIC PANEL
ALT: 13 IU/L (ref 0–32)
AST: 17 IU/L (ref 0–40)
Albumin/Globulin Ratio: 1.7 (ref 1.2–2.2)
Albumin: 4.4 g/dL (ref 3.5–5.5)
Alkaline Phosphatase: 63 IU/L (ref 39–117)
BUN / CREAT RATIO: 15 (ref 9–23)
BUN: 8 mg/dL (ref 6–20)
Bilirubin Total: 0.2 mg/dL (ref 0.0–1.2)
CALCIUM: 9 mg/dL (ref 8.7–10.2)
CO2: 22 mmol/L (ref 20–29)
Chloride: 102 mmol/L (ref 96–106)
Creatinine, Ser: 0.55 mg/dL — ABNORMAL LOW (ref 0.57–1.00)
GFR calc Af Amer: 139 mL/min/{1.73_m2} (ref >59)
GFR, EST NON AFRICAN AMERICAN: 120 mL/min/{1.73_m2} (ref >59)
GLOBULIN, TOTAL: 2.6 g/dL (ref 1.5–4.5)
GLUCOSE: 82 mg/dL (ref 65–99)
Potassium: 4.9 mmol/L (ref 3.5–5.2)
SODIUM: 140 mmol/L (ref 134–144)
TOTAL PROTEIN: 7 g/dL (ref 6.0–8.5)

## 2018-05-10 LAB — CBC
HEMATOCRIT: 39.9 % (ref 34.0–46.6)
Hemoglobin: 12.3 g/dL (ref 11.1–15.9)
MCH: 27.5 pg (ref 26.6–33.0)
MCHC: 30.8 g/dL — ABNORMAL LOW (ref 31.5–35.7)
MCV: 89 fL (ref 79–97)
PLATELETS: 252 10*3/uL (ref 150–450)
RBC: 4.47 x10E6/uL (ref 3.77–5.28)
RDW: 16.7 % — AB (ref 12.3–15.4)
WBC: 5.9 10*3/uL (ref 3.4–10.8)

## 2018-05-10 LAB — TSH: TSH: 4.42 u[IU]/mL (ref 0.450–4.500)

## 2018-05-16 ENCOUNTER — Ambulatory Visit
Admission: RE | Admit: 2018-05-16 | Discharge: 2018-05-16 | Disposition: A | Discharge status: Home / Self Care | Payer: BLUE CROSS/BLUE SHIELD | Source: Ambulatory Visit | Attending: Obstetrics & Gynecology | Admitting: Obstetrics & Gynecology

## 2018-05-16 DIAGNOSIS — N926 Irregular menstruation, unspecified: Secondary | ICD-10-CM

## 2018-05-29 ENCOUNTER — Telehealth: Payer: Self-pay | Admitting: Obstetrics & Gynecology

## 2018-05-29 NOTE — Telephone Encounter (Signed)
Return call to Mr. Ephriam Jenkinsas on [employer] release form.  Advised ultrasound completed, nothing abnormal per radiology report, but Dr. Hyacinth MeekerMiller will review.  Consult to discuss results and plan of care scheduled for 06/09/2018. Agreeable to plan.  Encounter closed.

## 2018-05-29 NOTE — Telephone Encounter (Signed)
Patient's spouse (ok per dpr) calling for results of her pelvic ultrasound.

## 2018-06-09 ENCOUNTER — Ambulatory Visit: Payer: BLUE CROSS/BLUE SHIELD | Admitting: Obstetrics & Gynecology

## 2018-06-09 ENCOUNTER — Encounter: Payer: Self-pay | Admitting: Obstetrics & Gynecology

## 2018-06-09 ENCOUNTER — Other Ambulatory Visit: Payer: Self-pay

## 2018-06-09 VITALS — BP 110/80 | HR 76 | Resp 16 | Ht 60.0 in | Wt 128.6 lb

## 2018-06-09 DIAGNOSIS — Z789 Other specified health status: Secondary | ICD-10-CM

## 2018-06-09 DIAGNOSIS — IMO0001 Reserved for inherently not codable concepts without codable children: Secondary | ICD-10-CM

## 2018-06-09 NOTE — Progress Notes (Signed)
GYNECOLOGY  VISIT  CC:   Discuss ultrasound results and contraception options  HPI: 37 y.o. [phone] Married [place] female here to discuss ultrasound results and discuss contraception options.  We have discussed tubal ligation several times in the past.  Pt's husband reports she will be desirous of no future pregnancies and then waver and decide she doesn't want to do anything permanent.  Procedure explained to both of them.  No translator was used as they both declined this today.  They both understand the permanent nature of a tubal ligation.  Pt does not say whether she wants to proceed or not.  Other options discussed.  She is not going to take a pill regularly and I do not think she would be a good candidate for Depo Provera due to frequency of receiving injection.  Mirena IUD, placement, improvement in bleeding, side effects all reviewed.  Husband thinks this is a good option.  He is supportive of not future child bearing but understands she may have some apprehension about surgery.  Timing of placement reviewed as well.  Questions answered.    PUS results form 05/16/18 reviewed.  Normal uterus with normal endometrium and normal ovaries discussed.  Questions answered.    She continues to have low back pain.  Did see Dr. Corliss Skains.  Did not do any PT.  X ray of spine was normal.    Husband would like costs of procedure for IUD placement and tubal ligation.  Asks this be called and emailed to him at [email or web address]  GYNECOLOGIC HISTORY: Patient's last menstrual period was 05/14/2018. Contraception: condoms  Menopausal hormone therapy: none  Patient Active Problem List   Diagnosis Date Noted  . Polyarthralgia 07/09/2017  . Myalgia 07/09/2017  . Pre-op exam 01/29/2017  . Language barrier 08/16/2016  . Need for influenza vaccination 11/15/2015  . Routine general medical examination at a health care facility 11/15/2015  . Chronic pain of both knees 11/15/2015  . Chronic back pain  11/15/2015    Past Medical History:  Diagnosis Date  . Chronic back pain    since childbirth, x several years as of 07/2014  . Chronic leg pain    left along with hip and back pain since childbirth as of 07/2014  . Low back pain 11/15/2015  . Medical history non-contributory   . Pelvic pain in female 11/15/2015  . Upper back pain 11/15/2015    Past Surgical History:  Procedure Laterality Date  . CESAREAN SECTION     x3  . CESAREAN SECTION N/A 12/03/2016   Procedure: CESAREAN SECTION;  Surgeon: Hermina Staggers, MD;  Location: [facility] BIRTHING SUITES;  Service: Obstetrics;  Laterality: N/A;    MEDS:   Current Outpatient Medications on File Prior to Visit  Medication Sig Dispense Refill  . levothyroxine (SYNTHROID) 50 MCG tablet Take 1 tablet (50 mcg total) by mouth daily before breakfast. 90 tablet 3  . Multiple Vitamin (MULTIVITAMIN) tablet Take 1 tablet by mouth daily.     No current facility-administered medications on file prior to visit.     ALLERGIES: Patient has no known allergies.  Family History  Problem Relation Age of Onset  . Liver cancer Mother 67  . Diabetes Father   . Healthy Sister   . Healthy Brother   . Healthy Son   . Healthy Son   . Healthy Son   . Cancer Neg Hx   . Heart disease Neg Hx   . Stroke Neg Hx     SH:  Married, non smoker  Review of Systems  All other systems reviewed and are negative.   PHYSICAL EXAMINATION:    BP 110/80 (BP Location: Right Arm, Patient Position: Sitting, Cuff Size: Normal)   Pulse 76   Resp 16   Ht 5' (1.524 m)   Wt 128 lb 9.6 oz (58.3 kg)   LMP 05/14/2018   BMI 25.12 kg/m     General appearance: alert, cooperative and appears stated age No exam performed today  Assessment: Contraception options discussed today  Plan: Information about procedures provided.  Costs will be confirmed for them.  They are likely leaning towards IUD placement.  Knows to call with onset of cycle for IUD placement if this is what she  ultimately desires.   ~25 minutes spent with patient >50% of time was in face to face discussion of above.

## 2018-11-07 ENCOUNTER — Telehealth: Payer: Self-pay | Admitting: Obstetrics & Gynecology

## 2018-11-07 NOTE — Telephone Encounter (Signed)
I attempted to reach this patient regarding her appointment request. I need to give information regarding her current out of network insurance.

## 2018-11-07 NOTE — Telephone Encounter (Signed)
Patient called to schedule an appointment with Dr.Miller. She is having "pain in her side".

## 2018-11-07 NOTE — Telephone Encounter (Signed)
Call to patient.  Voicemail box is full.  Unable to leave message.

## 2018-11-11 NOTE — Telephone Encounter (Signed)
Call to patient mobile.  Telephone Information:  Mobile [phone]   Voicemailbox full.   Call to home number, pt answered. Advised will call her back with bengali interpretor on this number. Patient agreeable.   Called patient back with [employer] interpret[employer] Bengali ID [phone]. Pt did not answer.  Unable to leave voicemail as voicemailbox full.

## 2018-11-18 NOTE — Telephone Encounter (Signed)
Ok to close encounter. 

## 2018-11-18 NOTE — Telephone Encounter (Signed)
Called again and unable to leave voicemail. No answer.   Routing to Dr. Hyacinth Meeker.  I have attempted to reach patient x 3 and call with Bengali Interpretor for appointment.   Okay to close encounter?

## 2018-12-16 ENCOUNTER — Encounter: Payer: Self-pay | Admitting: Medical

## 2018-12-16 ENCOUNTER — Other Ambulatory Visit: Payer: Self-pay

## 2018-12-16 ENCOUNTER — Ambulatory Visit: Payer: BLUE CROSS/BLUE SHIELD | Admitting: Medical

## 2018-12-16 VITALS — BP 124/80 | HR 70 | Temp 98.2°F | Resp 16 | Wt 124.2 lb

## 2018-12-16 DIAGNOSIS — R10819 Abdominal tenderness, unspecified site: Secondary | ICD-10-CM

## 2018-12-16 DIAGNOSIS — M545 Low back pain, unspecified: Secondary | ICD-10-CM

## 2018-12-16 LAB — POCT URINALYSIS DIP (PROADVANTAGE DEVICE)
BILIRUBIN UA: NEGATIVE
BILIRUBIN UA: NEGATIVE mg/dL
Glucose, UA: NEGATIVE mg/dL
LEUKOCYTES UA: NEGATIVE
Nitrite, UA: NEGATIVE
PROTEIN UA: NEGATIVE mg/dL
SPECIFIC GRAVITY, URINE: 1.015
Urobilinogen, Ur: NEGATIVE
pH, UA: 6 (ref 5.0–8.0)

## 2018-12-16 MED ORDER — NITROFURANTOIN MONOHYD MACRO 100 MG PO CAPS: 100.0000 mg | ORAL_CAPSULE | Freq: Two times a day (BID) | ORAL | 0 refills | Status: DC

## 2018-12-16 MED ORDER — IBUPROFEN 600 MG PO TABS
600.0000 mg | ORAL_TABLET | Freq: Two times a day (BID) | ORAL | 0 refills | Status: DC
Start: 2018-12-16 — End: 2019-01-28

## 2018-12-16 NOTE — Progress Notes (Signed)
Subjective: Chief Complaint  Patient presents with  . back pain    back pain near kidneys, abdominal pain    Here with husband who interprets.    Here for pain in left abdomen and back.  She has had some discomfort intermittent in the left to mid abdomen and lower abdomen for the last several days but gets this, pain from time to time for the last few months.  She sees a gynecologist, has irregular periods, last saw gynecologist back in September and had an ultrasound that did not show any abnormalities.  She notes some discomfort with urination, but no polyuria, no odor in urine, no blood in urine.  She denies vaginal complaint.  She denies fever body aches or chills.  No nausea vomiting.  She denies any loose stools.  She does occasionally get constipation but not regularly.  No history of kidney stones.  Typically has a daily bowel movement.  No other c/o.   Past Medical History:  Diagnosis Date  . Chronic back pain    since childbirth, x several years as of 07/2014  . Chronic leg pain    left along with hip and back pain since childbirth as of 07/2014  . Low back pain 11/15/2015  . Medical history non-contributory   . Pelvic pain in female 11/15/2015  . Upper back pain 11/15/2015   Current Outpatient Medications on File Prior to Visit  Medication Sig Dispense Refill  . levothyroxine (SYNTHROID) 50 MCG tablet Take 1 tablet (50 mcg total) by mouth daily before breakfast. 90 tablet 3  . Multiple Vitamin (MULTIVITAMIN) tablet Take 1 tablet by mouth daily.     No current facility-administered medications on file prior to visit.    ROS as in subjective   Objective: BP 124/80   Pulse 70   Temp 98.2 F (36.8 C) (Oral)   Resp 16   Wt 124 lb 3.2 oz (56.3 kg)   LMP 12/14/2018 (Exact Date)   SpO2 99%   BMI 24.26 kg/m    General appearance: alert, no distress, WD/WN,  Lungs: CTA bilaterally, no wheezes, rhonchi, or rales Back with mild CVA tenderness on the left, but normal  range of motion without pain, no other back pain or deformity Abdomen: +bs, soft, tender throughout the lower abdomen, mild tenderness left upper abdomen otherwise non tender, non distended, no masses, no hepatomegaly, no splenomegaly Pulses: 2+ symmetric, upper and lower extremities, normal cap refill She was fully clothed so unable to check the skin Declines GU exam   Assessment: Encounter Diagnoses  Name Primary?  . Low back pain without sciatica, unspecified back pain laterality, unspecified chronicity Yes  . Abdominal tenderness, rebound tenderness presence not specified, unspecified location      Plan: Etiology unclear.  I reviewed gynecology notes and pelvic ultrasound from fall 2019 in the chart  Possibility of urinary tract infection so we will put her on medication as below preemptively, but labs as below  Consider renal stones, consider gynecological issue  Faith was seen today for back pain.  Diagnoses and all orders for this visit:  Low back pain without sciatica, unspecified back pain laterality, unspecified chronicity -     POCT Urinalysis DIP (Proadvantage Device) -     Basic metabolic panel -     CBC with Differential/Platelet -     Urine Culture  Abdominal tenderness, rebound tenderness presence not specified, unspecified location -     Basic metabolic panel -     CBC  with Differential/Platelet -     Urine Culture  Other orders -     nitrofurantoin, macrocrystal-monohydrate, (MACROBID) 100 MG capsule; Take 1 capsule (100 mg total) by mouth 2 (two) times daily. -     ibuprofen (ADVIL,MOTRIN) 600 MG tablet; Take 1 tablet (600 mg total) by mouth 2 (two) times daily.

## 2018-12-17 LAB — URINE CULTURE: Organism ID, Bacteria: NO GROWTH

## 2018-12-17 LAB — BASIC METABOLIC PANEL
BUN/Creatinine Ratio: 16 (ref 9–23)
BUN: 8 mg/dL (ref 6–20)
CHLORIDE: 109 mmol/L — AB (ref 96–106)
CO2: 21 mmol/L (ref 20–29)
Calcium: 8.6 mg/dL — ABNORMAL LOW (ref 8.7–10.2)
Creatinine, Ser: 0.5 mg/dL — ABNORMAL LOW (ref 0.57–1.00)
GFR, EST AFRICAN AMERICAN: 143 mL/min/{1.73_m2} (ref >59)
GFR, EST NON AFRICAN AMERICAN: 124 mL/min/{1.73_m2} (ref >59)
Glucose: 125 mg/dL — ABNORMAL HIGH (ref 65–99)
POTASSIUM: 4.7 mmol/L (ref 3.5–5.2)
SODIUM: 140 mmol/L (ref 134–144)

## 2018-12-17 LAB — CBC WITH DIFFERENTIAL/PLATELET
BASOS ABS: 0 10*3/uL (ref 0.0–0.2)
BASOS: 0 %
EOS (ABSOLUTE): 0 10*3/uL (ref 0.0–0.4)
Eos: 0 %
Hematocrit: 33.1 % — ABNORMAL LOW (ref 34.0–46.6)
Hemoglobin: 11 g/dL — ABNORMAL LOW (ref 11.1–15.9)
Immature Grans (Abs): 0 10*3/uL (ref 0.0–0.1)
Immature Granulocytes: 0 %
LYMPHS ABS: 1.4 10*3/uL (ref 0.7–3.1)
Lymphs: 19 %
MCH: 29.4 pg (ref 26.6–33.0)
MCHC: 33.2 g/dL (ref 31.5–35.7)
MCV: 89 fL (ref 79–97)
MONOS ABS: 0.4 10*3/uL (ref 0.1–0.9)
Monocytes: 5 %
NEUTROS ABS: 5.8 10*3/uL (ref 1.4–7.0)
Neutrophils: 76 %
PLATELETS: 300 10*3/uL (ref 150–450)
RBC: 3.74 x10E6/uL — ABNORMAL LOW (ref 3.77–5.28)
RDW: 13.1 % (ref 11.7–15.4)
WBC: 7.7 10*3/uL (ref 3.4–10.8)

## 2018-12-22 ENCOUNTER — Other Ambulatory Visit: Payer: Self-pay | Admitting: Medical

## 2018-12-22 ENCOUNTER — Telehealth: Payer: Self-pay | Admitting: Obstetrics & Gynecology

## 2018-12-22 ENCOUNTER — Ambulatory Visit
Admission: RE | Admit: 2018-12-22 | Discharge: 2018-12-22 | Disposition: A | Discharge status: Home / Self Care | Payer: BLUE CROSS/BLUE SHIELD | Source: Ambulatory Visit | Attending: Medical | Admitting: Medical

## 2018-12-22 ENCOUNTER — Other Ambulatory Visit: Payer: Self-pay

## 2018-12-22 DIAGNOSIS — R319 Hematuria, unspecified: Secondary | ICD-10-CM

## 2018-12-22 DIAGNOSIS — R109 Unspecified abdominal pain: Secondary | ICD-10-CM

## 2018-12-22 NOTE — Telephone Encounter (Signed)
Patient's spouse Ranjit s calling asking for an appointment for his wife fro bleeding and pain. She would like to see Dr.Miller. Dpr on file to talk with spouse.

## 2018-12-22 NOTE — Telephone Encounter (Signed)
If patient returns call, please allow to hold or transfer to any available triage nurse. Thank you.    I called number listed for patient  Telephone Information:  Mobile [phone]   twice and both times received busy signal.   Patient was seen by PCP on 12/16/2018 and had labs.  Lab Results  Component Value Date   WBC 7.7 12/16/2018   HGB 11.0 (L) 12/16/2018   HCT 33.1 (L) 12/16/2018   MCV 89 12/16/2018   PLT 300 12/16/2018    Negative urine culture.  Per note LMP 12/14/2018

## 2018-12-23 NOTE — Telephone Encounter (Signed)
Call transferred from front office staff. Spoke with patients spouse Ranjit, ok per dpr. Spouse reports patient has been experiencing lower back pain 8/10 for the past 5 days. Spotting, spouse denies heavy bleeding. No contraceptive, "no chance of pregnancy", per spouse. Seen by PCP on 3/17, urine culture negative. Spouse reports patient is on abx. Denies N/V, fever/chills, upper respiratory symptoms. Spouse placed on brief hold, call reviewed with Dr. Hyacinth Meeker. Advised spouse patient needs to f/u with PCP for further evaluation of lower back pain. Return call to office if GYN OV still needed. Spouse verbalizes understanding and is agreeable.   Routing to provider for final review. Patient is agreeable to disposition. Will close encounter.

## 2018-12-24 ENCOUNTER — Telehealth: Payer: Self-pay | Admitting: Medical

## 2018-12-24 NOTE — Telephone Encounter (Signed)
I will check with gynecology today to get some advice.  What are current sympotms?

## 2018-12-24 NOTE — Telephone Encounter (Signed)
Pts husband called and said the Gynecologist told her she needed to be seen by her pcp. For the issues she was having. Would you like to schedule a phone visit with pt today or tomorrow?

## 2018-12-24 NOTE — Telephone Encounter (Signed)
Patient is having pain with urination. And also having blood in urine as well, just a small amount of blood. No other symptoms, GYN told him to scheduled with PCP. I told him once you got back with one of [place] we would call them tomorrow to probably schedule virtual visit for tomorrow.

## 2018-12-25 ENCOUNTER — Other Ambulatory Visit: Payer: Self-pay | Admitting: Medical

## 2018-12-25 MED ORDER — SULFAMETHOXAZOLE-TRIMETHOPRIM 800-160 MG PO TABS: 1.0000 | ORAL_TABLET | Freq: Two times a day (BID) | ORAL | 0 refills | Status: DC

## 2018-12-25 NOTE — Telephone Encounter (Signed)
Patient's husband notified  

## 2018-12-25 NOTE — Telephone Encounter (Signed)
I sent Bactrim, a second antibiotic to the pharmacy to help with still possible urinary tract infection although this is questionable given her urine culture that we did recently  I also contacted her gynecologist to get some feedback but have not heard back from the gynecologist  Make sure she is drinking plenty of water and also drinking some cranberry juice the next few days  If she is not improved by Monday then she will need to be rechecked including exam and possibly a pelvic exam.  Make sure husband understands this

## 2018-12-26 ENCOUNTER — Telehealth: Payer: Self-pay | Admitting: *Deleted

## 2018-12-26 NOTE — Telephone Encounter (Signed)
Call transferred from front office staff. Spoke with patients spouse "Ranjit", ok per dpr. States patient started abx on 3/26 prescribed by PCP, blood in urine on 3/26, denies vaginal bleeding. Reports lower back pain has improved, he states he is at work and has not spoken with his wife today. Reports vaginal itching inside the vaginal canal that is bothersome, denies vaginal d/c or odor. Has not tried anything OTC, recommended OTC monistat for vaginal itching. Requesting OV with Dr. Hyacinth Meeker.  Denies fever/chills, N/V, cough, dysuria, or severe pain.   OV scheduled with Dr. Hyacinth Meeker for 3/31 at 10am. Spouse aware to return call to office if any additional questions/concerns, new or worsening symptoms.   Routing to provider for final review. Patient is agreeable to disposition. Will close encounter.

## 2018-12-26 NOTE — Telephone Encounter (Signed)
Follow-up call to patient. No answer. No voice mail.

## 2018-12-29 ENCOUNTER — Telehealth: Payer: Self-pay | Admitting: *Deleted

## 2018-12-29 NOTE — Telephone Encounter (Signed)
Call to patients spouse, Ranjit, ok per dpr. No answer, no voicemail to leave message.    Reschedule 12/30/18 OV with Dr. Hyacinth Meeker.

## 2018-12-29 NOTE — Telephone Encounter (Signed)
Spoke with patients spouse, Ranjit. Reports vaginal itching and pain when voiding. Spouse is unclear if pain is coming from urine touching the skin. Denies vaginal bleeding, lower back pain, fever/chills. Advised Dr. Hyacinth Meeker will be out of the office on 3/31 due to emergency, offered OV with covering provider or Dr. Hyacinth Meeker next available. Spouse request OV with covering provider. OV scheduled for 3/30 at 10am with Dr. Oscar La.   Routing to provider for final review. Patient is agreeable to disposition. Will close encounter.

## 2018-12-30 ENCOUNTER — Ambulatory Visit: Payer: BLUE CROSS/BLUE SHIELD | Admitting: Obstetrics and Gynecology

## 2018-12-30 ENCOUNTER — Telehealth: Payer: Self-pay | Admitting: Obstetrics and Gynecology

## 2018-12-30 ENCOUNTER — Other Ambulatory Visit: Payer: Self-pay

## 2018-12-30 ENCOUNTER — Ambulatory Visit: Payer: Self-pay | Admitting: Obstetrics & Gynecology

## 2018-12-30 NOTE — Telephone Encounter (Signed)
Patient cancelled appointment today. Separate staff message to provider.

## 2018-12-31 NOTE — Telephone Encounter (Signed)
Spoke with patient's spouse, okay per ROI. Advised will need to contact insurance company to discuss in network gynecology providers to establish care for his wife. Can be seen at urgent care, ER, or PCP for any concerns or ongoing symptoms of vaginal itching and pain. Spouse verbalizes understanding.   Routing to provider and will close encounter.

## 2019-01-05 ENCOUNTER — Encounter: Payer: BLUE CROSS/BLUE SHIELD | Admitting: Medical

## 2019-01-27 ENCOUNTER — Encounter: Payer: Self-pay | Admitting: Medical

## 2019-01-27 ENCOUNTER — Ambulatory Visit: Payer: BLUE CROSS/BLUE SHIELD | Admitting: Medical

## 2019-01-27 ENCOUNTER — Other Ambulatory Visit: Payer: Self-pay

## 2019-01-27 ENCOUNTER — Telehealth: Payer: Self-pay | Admitting: Medical

## 2019-01-27 NOTE — Telephone Encounter (Signed)
Husband called & states wife having pain again in back and would like refill on pain medication. [place] Spaniel got better from before but now hurting again. Tried to do a virtual appointment but pt now has [facility] and he didn't know it so visit isn't covered.  So he would like refills on her medication

## 2019-01-28 ENCOUNTER — Other Ambulatory Visit: Payer: Self-pay | Admitting: Medical

## 2019-01-28 MED ORDER — IBUPROFEN 600 MG PO TABS: 600.0000 mg | ORAL_TABLET | Freq: Two times a day (BID) | ORAL | 0 refills | Status: AC

## 2019-02-13 NOTE — Telephone Encounter (Signed)
done

## 2019-02-18 ENCOUNTER — Encounter: Payer: BLUE CROSS/BLUE SHIELD | Admitting: Medical

## 2021-05-08 ENCOUNTER — Ambulatory Visit: Payer: Medicaid Other | Attending: Physician Assistant | Admitting: Audiologist

## 2021-05-08 DIAGNOSIS — H906 Mixed conductive and sensorineural hearing loss, bilateral: Secondary | ICD-10-CM | POA: Insufficient documentation
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
## 2021-05-25 ENCOUNTER — Other Ambulatory Visit: Payer: Self-pay | Admitting: Physician Assistant

## 2021-05-25 DIAGNOSIS — R102 Pelvic and perineal pain: Secondary | ICD-10-CM

## 2021-06-07 ENCOUNTER — Ambulatory Visit
Admission: RE | Admit: 2021-06-07 | Discharge: 2021-06-07 | Disposition: A | Discharge status: Home / Self Care | Payer: Medicaid Other | Source: Ambulatory Visit | Attending: Physician Assistant | Admitting: Physician Assistant

## 2021-06-07 ENCOUNTER — Other Ambulatory Visit: Payer: Medicaid Other

## 2021-06-07 ENCOUNTER — Other Ambulatory Visit: Payer: Self-pay

## 2021-06-07 DIAGNOSIS — R102 Pelvic and perineal pain: Secondary | ICD-10-CM

## 2021-09-03 ENCOUNTER — Encounter (HOSPITAL_COMMUNITY): Payer: Self-pay

## 2021-09-03 ENCOUNTER — Emergency Department (HOSPITAL_COMMUNITY): Payer: 59

## 2021-09-03 ENCOUNTER — Other Ambulatory Visit: Payer: Self-pay

## 2021-09-03 ENCOUNTER — Emergency Department (HOSPITAL_COMMUNITY)
Admission: EM | Admit: 2021-09-03 | Discharge: 2021-09-03 | Disposition: A | Discharge status: Home / Self Care | Payer: 59 | Attending: Emergency Medicine | Admitting: Emergency Medicine

## 2021-09-03 DIAGNOSIS — R519 Headache, unspecified: Secondary | ICD-10-CM | POA: Insufficient documentation

## 2021-09-03 DIAGNOSIS — Y92410 Unspecified street and highway as the place of occurrence of the external cause: Secondary | ICD-10-CM | POA: Diagnosis not present

## 2021-09-03 DIAGNOSIS — S161XXA Strain of muscle, fascia and tendon at neck level, initial encounter: Secondary | ICD-10-CM | POA: Diagnosis not present

## 2021-09-03 DIAGNOSIS — R102 Pelvic and perineal pain: Secondary | ICD-10-CM | POA: Insufficient documentation

## 2021-09-03 DIAGNOSIS — R0789 Other chest pain: Secondary | ICD-10-CM | POA: Diagnosis not present

## 2021-09-03 DIAGNOSIS — S169XXA Unspecified injury of muscle, fascia and tendon at neck level, initial encounter: Secondary | ICD-10-CM | POA: Diagnosis present

## 2021-09-03 DIAGNOSIS — S3991XA Unspecified injury of abdomen, initial encounter: Secondary | ICD-10-CM | POA: Diagnosis not present

## 2021-09-03 HISTORY — DX: Disorder of thyroid, unspecified: E07.9

## 2021-09-03 LAB — CBC
HCT: 39.1 % (ref 36.0–46.0)
Hemoglobin: 12.7 g/dL (ref 12.0–15.0)
MCH: 30.9 pg (ref 26.0–34.0)
MCHC: 32.5 g/dL (ref 30.0–36.0)
MCV: 95.1 fL (ref 80.0–100.0)
Platelets: 161 10*3/uL (ref 150–400)
RBC: 4.11 MIL/uL (ref 3.87–5.11)
RDW: 12.7 % (ref 11.5–15.5)
WBC: 6.9 10*3/uL (ref 4.0–10.5)
nRBC: 0 % (ref 0.0–0.2)

## 2021-09-03 LAB — I-STAT BETA HCG BLOOD, ED (MC, WL, AP ONLY): I-stat hCG, quantitative: <5 m[IU]/mL (ref <5)

## 2021-09-03 LAB — BASIC METABOLIC PANEL
Anion gap: 8 (ref 5–15)
BUN: 7 mg/dL (ref 6–20)
CO2: 20 mmol/L — ABNORMAL LOW (ref 22–32)
Calcium: 9.4 mg/dL (ref 8.9–10.3)
Chloride: 107 mmol/L (ref 98–111)
Creatinine, Ser: 0.58 mg/dL (ref 0.44–1.00)
GFR, Estimated: >60 mL/min (ref >60)
Glucose, Bld: 89 mg/dL (ref 70–99)
Potassium: 5 mmol/L (ref 3.5–5.1)
Sodium: 135 mmol/L (ref 135–145)

## 2021-09-03 MED ORDER — FENTANYL CITRATE PF 50 MCG/ML IJ SOSY
50.0000 ug | PREFILLED_SYRINGE | Freq: Once | INTRAMUSCULAR | Status: AC
  Administered 2021-09-03: 14:00:00 50 ug via INTRAVENOUS
  Filled 2021-09-03: qty 1

## 2021-09-03 MED ORDER — IOHEXOL 300 MG/ML  SOLN
100.0000 mL | Freq: Once | INTRAMUSCULAR | Status: AC | PRN
  Administered 2021-09-03: 15:00:00 100 mL via INTRAVENOUS

## 2021-09-03 MED ORDER — METHOCARBAMOL 500 MG PO TABS: 500.0000 mg | ORAL_TABLET | Freq: Three times a day (TID) | ORAL | 0 refills | Status: DC | PRN
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
## 2021-09-03 NOTE — Discharge Instructions (Signed)
There was a lung nodule.  With your exposure to smoke you may need follow-up which would be a another CAT scan in about a year.

## 2021-09-03 NOTE — ED Notes (Signed)
Pt teaching provided on medications that may cause drowsiness. Pt instructed not to drive or operate heavy machinery while taking the prescribed medication. Pt verbalized understanding.  ? ?Pt provided discharge instructions and prescription information. Pt was given the opportunity to ask questions and questions were answered. Discharge signature not obtained in the setting of the COVID-19 pandemic in order to reduce high touch surfaces.  ? ?
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
## 2021-09-03 NOTE — ED Notes (Signed)
EDP at bedside. Bengali interpreter services being utilized via phone.

## 2021-09-03 NOTE — ED Notes (Signed)
Patient transported to CT 

## 2022-01-16 ENCOUNTER — Other Ambulatory Visit (HOSPITAL_BASED_OUTPATIENT_CLINIC_OR_DEPARTMENT_OTHER): Payer: Self-pay | Admitting: Internal Medicine

## 2022-01-16 DIAGNOSIS — Z1231 Encounter for screening mammogram for malignant neoplasm of breast: Secondary | ICD-10-CM

## 2022-02-16 ENCOUNTER — Inpatient Hospital Stay (HOSPITAL_BASED_OUTPATIENT_CLINIC_OR_DEPARTMENT_OTHER): Admission: RE | Admit: 2022-02-16 | Payer: Medicaid Other | Source: Ambulatory Visit | Admitting: Radiology
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
## 2022-06-06 ENCOUNTER — Encounter: Payer: Self-pay | Admitting: Internal Medicine

## 2022-07-26 DIAGNOSIS — E039 Hypothyroidism, unspecified: Secondary | ICD-10-CM | POA: Diagnosis not present

## 2022-07-26 DIAGNOSIS — Z23 Encounter for immunization: Secondary | ICD-10-CM | POA: Diagnosis not present

## 2022-07-26 DIAGNOSIS — E663 Overweight: Secondary | ICD-10-CM | POA: Diagnosis not present

## 2022-07-26 DIAGNOSIS — K5909 Other constipation: Secondary | ICD-10-CM | POA: Diagnosis not present

## 2022-08-07 DIAGNOSIS — E039 Hypothyroidism, unspecified: Secondary | ICD-10-CM | POA: Diagnosis not present

## 2022-10-22 DIAGNOSIS — M545 Low back pain, unspecified: Secondary | ICD-10-CM | POA: Diagnosis not present

## 2022-10-22 DIAGNOSIS — R1013 Epigastric pain: Secondary | ICD-10-CM | POA: Diagnosis not present

## 2022-11-21 ENCOUNTER — Encounter (HOSPITAL_COMMUNITY): Payer: Self-pay

## 2022-11-21 ENCOUNTER — Ambulatory Visit (HOSPITAL_COMMUNITY)
Admission: RE | Admit: 2022-11-21 | Discharge: 2022-11-21 | Disposition: A | Discharge status: Home / Self Care | Payer: Self-pay | Source: Ambulatory Visit | Attending: Emergency Medicine | Admitting: Emergency Medicine

## 2022-11-21 VITALS — BP 136/90 | HR 83 | Temp 98.7°F | Resp 16

## 2022-11-21 DIAGNOSIS — Z1152 Encounter for screening for COVID-19: Secondary | ICD-10-CM | POA: Insufficient documentation

## 2022-11-21 DIAGNOSIS — R109 Unspecified abdominal pain: Secondary | ICD-10-CM

## 2022-11-21 DIAGNOSIS — K5901 Slow transit constipation: Secondary | ICD-10-CM

## 2022-11-21 DIAGNOSIS — J020 Streptococcal pharyngitis: Secondary | ICD-10-CM

## 2022-11-21 DIAGNOSIS — E039 Hypothyroidism, unspecified: Secondary | ICD-10-CM

## 2022-11-21 LAB — POCT URINALYSIS DIPSTICK, ED / UC
Glucose, UA: NEGATIVE mg/dL
Ketones, ur: 15 mg/dL — AB
Leukocytes,Ua: NEGATIVE
Nitrite: NEGATIVE
Protein, ur: 100 mg/dL — AB
Specific Gravity, Urine: >=1.03 (ref 1.005–1.030)
Urobilinogen, UA: 0.2 mg/dL (ref 0.0–1.0)
pH: 5 (ref 5.0–8.0)

## 2022-11-21 LAB — POCT RAPID STREP A, ED / UC: Streptococcus, Group A Screen (Direct): POSITIVE — AB

## 2022-11-21 LAB — POC URINE PREG, ED: Preg Test, Ur: NEGATIVE

## 2022-11-21 LAB — SARS CORONAVIRUS 2 (TAT 6-24 HRS): SARS Coronavirus 2: NEGATIVE

## 2022-11-21 MED ORDER — POLYETHYLENE GLYCOL 3350 17 G PO PACK: 17.0000 g | PACK | Freq: Every day | ORAL | 0 refills | Status: DC | PRN

## 2022-11-21 MED ORDER — AMOXICILLIN 500 MG PO CAPS: 500.0000 mg | ORAL_CAPSULE | Freq: Two times a day (BID) | ORAL | 0 refills | Status: AC

## 2022-11-21 MED ORDER — DOCUSATE SODIUM 100 MG PO CAPS: 100.0000 mg | ORAL_CAPSULE | Freq: Two times a day (BID) | ORAL | 0 refills | Status: AC

## 2022-11-21 NOTE — ED Triage Notes (Signed)
Pt is here for fever at night, sore throat at night, runny nose , pt has been exsposedto strep due to her children tested positive yesterday

## 2022-11-21 NOTE — ED Provider Notes (Signed)
[facility]    CSN: [id number] Arrival date & time: 11/21/22  [id number]      History   Chief Complaint No chief complaint on file.   HPI Chelsea Cook is a 42 y.o. female.   Patient reports her children recently tested positive for strep and now she has fever, sore throat and nasal congestion. Reports ear fullness and fatigue, dry cough.  She also reports new onset of left flank pain and dysuria.  Reports last menstrual period was last month around the 18th, does have irregular periods. Patient does have hypothyroidism, reports seeing her primary care last month and compliance with her Synthroid.  Does endorse constipation and straining to evacuate her bowels.  Denies shortness of breath, wheezing, chest pain.  Denies nausea, vomiting or diarrhea.  The history is provided by the patient. The history is limited by a language barrier. A language interpreter was used.    Past Medical History:  Diagnosis Date   Chronic back pain    since childbirth, x several years as of 07/2014   Chronic leg pain    left along with hip and back pain since childbirth as of 07/2014   Low back pain 11/15/2015   Medical history non-contributory    Pelvic pain in female 11/15/2015   Thyroid disease    Upper back pain 11/15/2015    Patient Active Problem List   Diagnosis Date Noted   Polyarthralgia 07/09/2017   Myalgia 07/09/2017   Pre-op exam 01/29/2017   Language barrier 08/16/2016   Need for influenza vaccination 11/15/2015   Routine general medical examination at a health care facility 11/15/2015   Chronic pain of both knees 11/15/2015   Chronic back pain 11/15/2015    Past Surgical History:  Procedure Laterality Date   CESAREAN SECTION     x3   CESAREAN SECTION N/A 12/03/2016   Procedure: CESAREAN SECTION;  Surgeon: Chancy Milroy, MD;  Location: [facility];  Service: Obstetrics;  Laterality: N/A;    OB History     Gravida  5   Para  3   Term  2   Preterm  1    AB  2   Living  3      SAB  2   IAB      Ectopic      Multiple  0   Live Births  3            Home Medications    Prior to Admission medications   Medication Sig Start Date End Date Taking? Authorizing Provider  amoxicillin (AMOXIL) 500 MG capsule Take 1 capsule (500 mg total) by mouth 2 (two) times daily for 10 days. 11/21/22 12/01/22 Yes Louretta Shorten, [place], FNP  docusate sodium (COLACE) 100 MG capsule Take 1 capsule (100 mg total) by mouth every 12 (twelve) hours. 11/21/22  Yes Louretta Shorten, [place], FNP  ibuprofen (ADVIL) 600 MG tablet Take 1 tablet (600 mg total) by mouth 2 (two) times daily. 01/28/19  Yes Tysinger, Camelia Eng, PA-C  levothyroxine (SYNTHROID) 50 MCG tablet Take 1 tablet (50 mcg total) by mouth daily before breakfast. 02/24/18  Yes Megan Salon, MD  Multiple Vitamin (MULTIVITAMIN) tablet Take 1 tablet by mouth daily.   Yes [provider]  pantoprazole (PROTONIX) 40 MG tablet Take 40 mg by mouth daily.   Yes [provider]  polyethylene glycol (MIRALAX) 17 g packet Take 17 g by mouth daily as needed (17 g (about one heaping  tablespoon) ORALLY per day dissolved in 4 to 8 ounces of water, juice, soda, or other beverage). 11/21/22  Yes Louretta Shorten, [place], FNP  tiZANidine (ZANAFLEX) 4 MG capsule Take 4 mg by mouth 3 (three) times daily.   Yes [provider]  methocarbamol (ROBAXIN) 500 MG tablet Take 1 tablet (500 mg total) by mouth every 8 (eight) hours as needed for muscle spasms. 09/03/21   Davonna Belling, MD    Family History Family History  Problem Relation Age of Onset   Liver cancer Mother 75   Diabetes Father    Healthy Sister    Healthy Brother    Healthy Son    Healthy Son    Healthy Son    Cancer Neg Hx    Heart disease Neg Hx    Stroke Neg Hx     Social History Social History   Tobacco Use   Smoking status: Never   Smokeless tobacco: Never  Vaping Use   Vaping Use: Never used  Substance Use Topics    Alcohol use: No    Alcohol/week: 0.0 standard drinks of alcohol   Drug use: No     Allergies   Patient has no known allergies.   Review of Systems Review of Systems  Constitutional:  Positive for chills, fatigue and fever.  HENT:  Positive for ear pain and sore throat.   Eyes:  Negative for pain and visual disturbance.  Respiratory:  Positive for cough. Negative for shortness of breath.   Cardiovascular:  Negative for chest pain and palpitations.  Gastrointestinal:  Positive for abdominal pain and constipation. Negative for diarrhea and vomiting.  Genitourinary:  Positive for dysuria and flank pain. Negative for hematuria.  Musculoskeletal:  Negative for arthralgias and back pain.  Skin:  Negative for color change and rash.  Neurological:  Negative for seizures and syncope.  All other systems reviewed and are negative.    Physical Exam Triage Vital Signs ED Triage Vitals [11/21/22 0931]  Enc Vitals Group     BP (!) 136/90     Pulse Rate 83     Resp 16     Temp 98.7 F (37.1 C)     Temp Source Oral     SpO2 98 %     Weight      Height      Head Circumference      Peak Flow      Pain Score      Pain Loc      Pain Edu?      Excl. in [place]?    No data found.  Updated Vital Signs BP (!) 136/90 (BP Location: Right Arm)   Pulse 83   Temp 98.7 F (37.1 C) (Oral)   Resp 16   LMP 11/19/2022   SpO2 98%   Visual Acuity Right Eye Distance:   Left Eye Distance:   Bilateral Distance:    Right Eye Near:   Left Eye Near:    Bilateral Near:     Physical Exam Vitals and nursing note reviewed.  Constitutional:      General: She is not in acute distress.    Appearance: Normal appearance. She is well-developed.     Comments: Pleasant 42 year old female who appears stated age.  HENT:     Head: Normocephalic and atraumatic.     Right Ear: Tympanic membrane, ear canal and external ear normal. There is no impacted cerumen.     Left Ear: Tympanic membrane, ear canal and  external ear normal. There is no impacted cerumen.     Nose: Rhinorrhea present.     Mouth/Throat:     Mouth: Mucous membranes are moist.     Pharynx: Posterior oropharyngeal erythema present.  Eyes:     Conjunctiva/sclera: Conjunctivae normal.  Cardiovascular:     Rate and Rhythm: Normal rate and regular rhythm.     Heart sounds: Normal heart sounds, S1 normal and S2 normal. No murmur heard. Pulmonary:     Effort: Pulmonary effort is normal. No respiratory distress.     Breath sounds: Normal breath sounds.     Comments: Lungs vesicular posteriorly. Abdominal:     General: Abdomen is flat.     Palpations: Abdomen is soft.     Tenderness: There is abdominal tenderness.  Musculoskeletal:        General: No swelling.     Cervical back: Neck supple.  Lymphadenopathy:     Head:     Right side of head: Submandibular adenopathy present.     Left side of head: Submandibular adenopathy present.     Cervical: Cervical adenopathy present.  Skin:    General: Skin is warm and dry.     Capillary Refill: Capillary refill takes less than 2 seconds.  Neurological:     Mental Status: She is alert.  Psychiatric:        Mood and Affect: Mood normal.        Behavior: Behavior is cooperative.      UC Treatments / Results  Labs (all labs ordered are listed, but only abnormal results are displayed) Labs Reviewed  POCT RAPID STREP A, ED / UC - Abnormal; Notable for the following components:      Result Value   Streptococcus, Group A Screen (Direct) POSITIVE (*)    All other components within normal limits  POCT URINALYSIS DIPSTICK, ED / UC - Abnormal; Notable for the following components:   Bilirubin Urine MODERATE (*)    Ketones, ur 15 (*)    Hgb urine dipstick LARGE (*)    Protein, ur 100 (*)    All other components within normal limits  SARS CORONAVIRUS 2 (TAT 6-24 HRS)  URINE CULTURE  POC URINE PREG, ED    EKG   Radiology No results found.  Procedures Procedures (including  critical care time)  Medications Ordered in UC Medications - No data to display  Initial Impression / Assessment and Plan / UC Course  I have reviewed the triage vital signs and the nursing notes.  Pertinent labs & imaging results that were available during my care of the patient were reviewed by me and considered in my medical decision making (see chart for details).  Vital signs and nursing note reviewed, patient is hemodynamically stable. Reports dysuria and flank pain, urinalysis positive for blood, currently on menses.  Will send for culture.   Sore throat and recent strep exposure, rapid strep testing in clinic was positive today, will treat accordingly.  Due to her body aches and cough, tested for Covid-19 today in clinic, will continue symptomatic treatment.   Patient voiced concerns over constipation, encouraged to drink at least 64 ounces of water and start a high-fiber diet.  Will provide with stool softener and MiraLAX.  Encourage follow-up with primary care provider and TSH monitoring.  Plan of care and return precautions discussed as well as follow-up with her PCP, patient verbalized understanding.     Final Clinical Impressions(s) / UC Diagnoses   Final diagnoses:  Streptococcal  sore throat  Flank pain, acute  Acquired hypothyroidism  Slow transit constipation     Discharge Instructions      ?? ???????? ????? ????? ??????? ??????? ?????? ??????, ???? ?????? ???????????????? ????? ??????? ???? ???? ??? ?? ?? ???? ????? Covid-19 ??????? ?????? ??? ???? ?? ???? ??? ?????, ???? ??? ?????? ?????? ???? ???????? ??????? ??????? ??? ????????????? ???? ?? ???? ???????? ??????????? ??????? ????  ????? ???????????????? ???????? ????? ???????? ???? ????? ???????? ???? ??????????? ???? ??????? ??? ???-?? ???? ??? ????? 50 mcg Synthroid ????? ??? ??????? ????  ????? ?????????????? ????, ??? ???? ???? ?????? ????? ??? ???????? ??????? 64 ????? ?? ??? ???? ??????? ???? ?????  ??????? ???? ???? ??????? ??? ???????? ????? ?? ???????, ????? ???? ???? Miralax? ???? ??????  ??????? ??? ???????? ???? ??? ??? ???? ???, ?????????, ??????????? ???? ????? ?? ???????? ?????? ?? ????? ?ja klinik? ?pan?ra druta s?r?pa par?k?? paji?ibha ha??ch?, ?mar? ?pan?k? ay?n?ib????ika di?? ciki?s? [place]. Da?? kar? ?'i saba nina. ?pan?ra Covid-19 par?k?? paji?ibha hal? ?mar? kala karaba. Gal? byath?, jbara ?ba? ?ar?r?ra byath?ra jan'ya pra??jana anus?r? ??'il?nala ?ba? ?'ibupr?ph?n?ra s?th? ?ra jan'ya lak?a???a byabasth?pan? c?li?? y?na.  ?pan?ra h?'ip?th?'ira???ijama sampark? ?pan?ra udb?g?ra jan'ya ?pan?ra pr?thamika yatna prad?nak?r?ra s?th? anugraha kar? phal?-?pa karuna ?ba? ?pan?ra 50 mcg Synthroid graha?a kar? c?li?? y?na.  ?pan?ra k???hak??hin'y?ra jan'ya, ?mi ?ka?i ucca ph?'ib?ra kh?dya ?ba? pratidina [place]?? 64 ?'unsa jala p?na kar?ra par?mar?a d?ba. ??it? s?h?yya kar?ra jan'ya anugraha kar? pratidina ?kab?ra mala saph?an?ra, k?l?sa nina. ?pani Miralax? nit? p?r?na.  Anugraha kar? abilamb? yatna nina yadi ?pani bami, ???ari??, kramabardham?na p??? byath? b? upasarg?ra unnati n? kar?na.     ED Prescriptions     Medication Sig Dispense Auth. Provider   amoxicillin (AMOXIL) 500 MG capsule Take 1 capsule (500 mg total) by mouth 2 (two) times daily for 10 days. 20 capsule Louretta Shorten, [place], [place]   docusate sodium (COLACE) 100 MG capsule Take 1 capsule (100 mg total) by mouth every 12 (twelve) hours. 60 capsule Louretta Shorten, [place], [place]   polyethylene glycol (MIRALAX) 17 g packet Take 17 g by mouth daily as needed (17 g (about one heaping tablespoon) ORALLY per day dissolved in 4 to 8 ounces of water, juice, soda, or other beverage). 38 each Yaqueline Gutter, [place], FNP      I have reviewed the PDMP during this encounter.   Katti Pelle, [place], [place] 11/21/22 1053

## 2022-11-21 NOTE — Discharge Instructions (Addendum)
?? ???????? ????? ????? ??????? ??????? ?????? ??????, ???? ?????? ???????????????? ????? ??????? ???? ???? ??? ?? ?? ???? ?????   Covid-19 ??????? ?????? ??? ???? ?? ???? ??? ?????, ???? ??? ?????? ?????? ???? ???????? ??????? ??????? ??? ????????????? ???? ?? ???? ???????? ??????????? ??????? ????  ????? ???????????????? ???????? ????? ???????? ???? ????? ???????? ???? ??????????? ???? ??????? ??? ???-?? ???? ??? ????? 50 mcg Synthroid ????? ??? ??????? ????  ????? ?????????????? ????, ??? ???? ???? ?????? ????? ??? ???????? ??????? 64 ????? ?? ??? ???? ??????? ???? ????? ??????? ???? ???? ??????? ??? ???????? ????? ?? ???????, ????? ???? ???? Miralax? ???? ??????  ??????? ??? ???????? ???? ??? ??? ???? ???, ?????????, ??????????? ???? ????? ?? ???????? ?????? ?? ????? ?ja klinik? ?pan?ra druta s?r?pa par?k?? paji?ibha ha??ch?, ?mar? ?pan?k? ay?n?ib????ika di?? ciki?s? [place]. Da?? kar? ?'i saba nina. ?pan?ra Covid-19 par?k?? paji?ibha hal? ?mar? kala karaba. Gal? byath?, jbara ?ba? ?ar?r?ra byath?ra jan'ya pra??jana anus?r? ??'il?nala ?ba? ?'ibupr?ph?n?ra s?th? ?ra jan'ya lak?a???a byabasth?pan? c?li?? y?na.  ?pan?ra h?'ip?th?'ira???ijama sampark? ?pan?ra udb?g?ra jan'ya ?pan?ra pr?thamika yatna prad?nak?r?ra s?th? anugraha kar? phal?-?pa karuna ?ba? ?pan?ra 50 mcg Synthroid graha?a kar? c?li?? y?na.  ?pan?ra k???hak??hin'y?ra jan'ya, ?mi ?ka?i ucca ph?'ib?ra kh?dya ?ba? pratidina [place]?? 64 ?'unsa jala p?na kar?ra par?mar?a d?ba. ??it? s?h?yya kar?ra jan'ya anugraha kar? pratidina ?kab?ra mala saph?an?ra, k?l?sa nina. ?pani Miralax? nit? p?r?na.  Anugraha kar? abilamb? yatna nina yadi ?pani bami, ???ari??, kramabardham?na p??? byath? b? upasarg?ra unnati n? kar?na.

## 2022-11-22 LAB — URINE CULTURE: Culture: NO GROWTH

## 2022-12-20 DIAGNOSIS — R1013 Epigastric pain: Secondary | ICD-10-CM | POA: Diagnosis not present

## 2022-12-20 DIAGNOSIS — Z131 Encounter for screening for diabetes mellitus: Secondary | ICD-10-CM | POA: Diagnosis not present

## 2022-12-20 DIAGNOSIS — Z0001 Encounter for general adult medical examination with abnormal findings: Secondary | ICD-10-CM | POA: Diagnosis not present

## 2022-12-20 DIAGNOSIS — Z136 Encounter for screening for cardiovascular disorders: Secondary | ICD-10-CM | POA: Diagnosis not present

## 2022-12-20 DIAGNOSIS — E039 Hypothyroidism, unspecified: Secondary | ICD-10-CM | POA: Diagnosis not present

## 2022-12-20 DIAGNOSIS — K5909 Other constipation: Secondary | ICD-10-CM | POA: Diagnosis not present

## 2023-08-12 DIAGNOSIS — E039 Hypothyroidism, unspecified: Secondary | ICD-10-CM | POA: Diagnosis not present

## 2023-08-12 DIAGNOSIS — Z23 Encounter for immunization: Secondary | ICD-10-CM | POA: Diagnosis not present

## 2023-08-12 DIAGNOSIS — F439 Reaction to severe stress, unspecified: Secondary | ICD-10-CM | POA: Diagnosis not present

## 2023-08-12 DIAGNOSIS — R1013 Epigastric pain: Secondary | ICD-10-CM | POA: Diagnosis not present

## 2023-10-24 DIAGNOSIS — R1013 Epigastric pain: Secondary | ICD-10-CM | POA: Diagnosis not present

## 2023-10-24 DIAGNOSIS — N946 Dysmenorrhea, unspecified: Secondary | ICD-10-CM | POA: Diagnosis not present

## 2023-10-24 DIAGNOSIS — E039 Hypothyroidism, unspecified: Secondary | ICD-10-CM | POA: Diagnosis not present

## 2023-10-24 DIAGNOSIS — Z111 Encounter for screening for respiratory tuberculosis: Secondary | ICD-10-CM | POA: Diagnosis not present

## 2023-12-30 DIAGNOSIS — N946 Dysmenorrhea, unspecified: Secondary | ICD-10-CM | POA: Diagnosis not present

## 2023-12-30 DIAGNOSIS — R1013 Epigastric pain: Secondary | ICD-10-CM | POA: Diagnosis not present

## 2023-12-30 DIAGNOSIS — E039 Hypothyroidism, unspecified: Secondary | ICD-10-CM | POA: Diagnosis not present

## 2023-12-30 DIAGNOSIS — Z1159 Encounter for screening for other viral diseases: Secondary | ICD-10-CM | POA: Diagnosis not present

## 2023-12-30 DIAGNOSIS — R5383 Other fatigue: Secondary | ICD-10-CM | POA: Diagnosis not present

## 2023-12-30 DIAGNOSIS — J069 Acute upper respiratory infection, unspecified: Secondary | ICD-10-CM | POA: Diagnosis not present

## 2023-12-30 DIAGNOSIS — M2550 Pain in unspecified joint: Secondary | ICD-10-CM | POA: Diagnosis not present

## 2024-01-06 ENCOUNTER — Other Ambulatory Visit: Payer: Self-pay | Admitting: Physician Assistant

## 2024-01-06 DIAGNOSIS — D509 Iron deficiency anemia, unspecified: Secondary | ICD-10-CM | POA: Diagnosis not present

## 2024-01-06 DIAGNOSIS — R5383 Other fatigue: Secondary | ICD-10-CM | POA: Diagnosis not present

## 2024-01-06 DIAGNOSIS — R102 Pelvic and perineal pain: Secondary | ICD-10-CM | POA: Diagnosis not present

## 2024-01-06 DIAGNOSIS — E039 Hypothyroidism, unspecified: Secondary | ICD-10-CM | POA: Diagnosis not present

## 2024-01-06 DIAGNOSIS — R1013 Epigastric pain: Secondary | ICD-10-CM | POA: Diagnosis not present

## 2024-01-06 DIAGNOSIS — M2550 Pain in unspecified joint: Secondary | ICD-10-CM | POA: Diagnosis not present

## 2024-01-06 DIAGNOSIS — R768 Other specified abnormal immunological findings in serum: Secondary | ICD-10-CM | POA: Diagnosis not present

## 2024-01-06 DIAGNOSIS — N946 Dysmenorrhea, unspecified: Secondary | ICD-10-CM | POA: Diagnosis not present

## 2024-01-06 DIAGNOSIS — R7303 Prediabetes: Secondary | ICD-10-CM | POA: Diagnosis not present

## 2024-01-30 ENCOUNTER — Telehealth: Payer: Self-pay

## 2024-01-30 NOTE — Telephone Encounter (Signed)
 Spoke with pt via phone call to confirm appt with Dr. Randye Buttner at [facility] tomorrow at 10:30am. Patient agreed.

## 2024-01-31 ENCOUNTER — Inpatient Hospital Stay: Payer: Self-pay | Attending: Oncology | Admitting: Oncology

## 2024-01-31 ENCOUNTER — Other Ambulatory Visit: Payer: Self-pay

## 2024-01-31 ENCOUNTER — Inpatient Hospital Stay: Payer: Self-pay

## 2024-01-31 VITALS — BP 125/97 | HR 92 | Temp 97.4°F | Resp 16 | Wt 134.5 lb

## 2024-01-31 DIAGNOSIS — E039 Hypothyroidism, unspecified: Secondary | ICD-10-CM | POA: Insufficient documentation

## 2024-01-31 DIAGNOSIS — D500 Iron deficiency anemia secondary to blood loss (chronic): Secondary | ICD-10-CM | POA: Diagnosis not present

## 2024-01-31 DIAGNOSIS — Z800 Family history of malignant neoplasm of digestive organs: Secondary | ICD-10-CM | POA: Insufficient documentation

## 2024-01-31 LAB — CMP (CANCER CENTER ONLY)
ALT: 12 U/L (ref 0–44)
AST: 14 U/L — ABNORMAL LOW (ref 15–41)
Albumin: 4.7 g/dL (ref 3.5–5.0)
Alkaline Phosphatase: 43 U/L (ref 38–126)
Anion gap: 6 (ref 5–15)
BUN: 8 mg/dL (ref 6–20)
CO2: 23 mmol/L (ref 22–32)
Calcium: 9.3 mg/dL (ref 8.9–10.3)
Chloride: 108 mmol/L (ref 98–111)
Creatinine: 0.56 mg/dL (ref 0.44–1.00)
GFR, Estimated: >60 mL/min (ref >60)
Glucose, Bld: 88 mg/dL (ref 70–99)
Potassium: 3.9 mmol/L (ref 3.5–5.1)
Sodium: 137 mmol/L (ref 135–145)
Total Bilirubin: 0.4 mg/dL (ref 0.0–1.2)
Total Protein: 7.9 g/dL (ref 6.5–8.1)

## 2024-01-31 LAB — CBC WITH DIFFERENTIAL (CANCER CENTER ONLY)
Abs Immature Granulocytes: 0.01 10*3/uL (ref 0.00–0.07)
Basophils Absolute: 0 10*3/uL (ref 0.0–0.1)
Basophils Relative: 1 %
Eosinophils Absolute: 0 10*3/uL (ref 0.0–0.5)
Eosinophils Relative: 1 %
HCT: 28.4 % — ABNORMAL LOW (ref 36.0–46.0)
Hemoglobin: 8.5 g/dL — ABNORMAL LOW (ref 12.0–15.0)
Immature Granulocytes: 0 %
Lymphocytes Relative: 27 %
Lymphs Abs: 1.8 10*3/uL (ref 0.7–4.0)
MCH: 21 pg — ABNORMAL LOW (ref 26.0–34.0)
MCHC: 29.9 g/dL — ABNORMAL LOW (ref 30.0–36.0)
MCV: 70.1 fL — ABNORMAL LOW (ref 80.0–100.0)
Monocytes Absolute: 0.4 10*3/uL (ref 0.1–1.0)
Monocytes Relative: 6 %
Neutro Abs: 4.4 10*3/uL (ref 1.7–7.7)
Neutrophils Relative %: 65 %
Platelet Count: 264 10*3/uL (ref 150–400)
RBC: 4.05 MIL/uL (ref 3.87–5.11)
RDW: 17.9 % — ABNORMAL HIGH (ref 11.5–15.5)
WBC Count: 6.6 10*3/uL (ref 4.0–10.5)
nRBC: 0 % (ref 0.0–0.2)

## 2024-01-31 LAB — FOLATE: Folate: 13.2 ng/mL (ref >5.9)

## 2024-01-31 LAB — VITAMIN B12: Vitamin B-12: 240 pg/mL (ref 180–914)

## 2024-01-31 LAB — IRON AND IRON BINDING CAPACITY (CC-WL,HP ONLY)
Iron: 20 ug/dL — ABNORMAL LOW (ref 28–170)
Saturation Ratios: 3 % — ABNORMAL LOW (ref 10.4–31.8)
TIBC: 637 ug/dL — ABNORMAL HIGH (ref 250–450)
UIBC: 617 ug/dL — ABNORMAL HIGH (ref 148–442)

## 2024-01-31 LAB — FERRITIN: Ferritin: 3 ng/mL — ABNORMAL LOW (ref 11–307)

## 2024-01-31 MED ORDER — FERROUS SULFATE 325 (65 FE) MG PO TBEC: 325.0000 mg | DELAYED_RELEASE_TABLET | Freq: Every day | ORAL | 3 refills | Status: AC
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
## 2024-01-31 NOTE — Progress Notes (Signed)
 "  [facility]  HEMATOLOGY CLINIC CONSULTATION NOTE   PATIENT NAME: Chelsea Cook   MR#: [id number] DOB: 01-20-1981  DATE OF SERVICE: 01/31/2024  Patient Care Team: Catalina Bare, MD as PCP - General (Internal Medicine)  REASON FOR CONSULTATION/ CHIEF COMPLAINT:  Evaluation of anemia.  ASSESSMENT & PLAN:   Chelsea Cook is a 43 y.o. lady, originally from [place], with a past medical history of hypothyroidism, GERD, was referred to our service for evaluation of iron  deficiency anemia.    Iron  deficiency anemia due to chronic blood loss Chronic iron  deficiency anemia likely due to cumulative menstrual blood loss. Hemoglobin decreased from 9.3 in January to 8.6 in March. Iron  studies indicate severe deficiency with iron  saturation at 3%, ferritin at 3, and iron  at 16. No other bleeding sources identified. Discussed dietary iron  sources and its role in hemoglobin synthesis.   Labs today revealed persistent iron  deficiency anemia with hemoglobin of 8.5, hematocrit 28.4, MCV 70.1.  Ferritin decreased at 3, iron  decreased at 20, iron  saturation decreased at 3%.  Discussed treatment options: oral iron  supplementation and intravenous iron  infusion.  Patient and her husband chose oral iron  due to convenience and preference. Discussed potential side effects: constipation, dark stools, nausea. Advised taking iron  with vitamin C to enhance absorption. IV iron  offers faster response but requires multiple infusion center visits. - Prescribe oral iron  tablets once daily with vitamin C to enhance absorption. - Order blood work to recheck blood counts and iron  levels. - Reassess in 3-4 months to evaluate treatment response.  Hypothyroidism Hypothyroidism managed with levothyroxine .  Continue follow-up with PCP for medication adjustment and monitoring.   Since the cause of anemia seems to be obvious from iron  deficiency, I am not pursuing extensive workup at this time.  If inadequate  response to iron  replacement is noted, we will pursue workup to rule out other etiologies.  I reviewed lab results and outside records for this visit and discussed relevant results with the patient. Diagnosis, plan of care and treatment options were also discussed in detail with the patient. Opportunity provided to ask questions and answers provided to her apparent satisfaction. Provided instructions to call our clinic with any problems, questions or concerns prior to return visit. I recommended to continue follow-up with PCP and sub-specialists. She verbalized understanding and agreed with the plan. No barriers to learning was detected.  Adiya Selmer, MD [facility] - A DEPT OF [facility] [address] [place] [address] Dept: [phone] Dept Fax: [phone]    HISTORY OF PRESENT ILLNESS:  Discussed the use of AI scribe software for clinical note transcription with the patient, who gave verbal consent to proceed.  History of Present Illness Chelsea Cook is a 43 year old female who presents with iron  deficiency anemia. She is accompanied by her husband. She was referred by Dr. Joelyn Lares for evaluation of anemia.  She has persistent microcytic anemia with hemoglobin levels of 8.6 g/dL in March 2025 and 9.3 g/dL in January 7974. Her hematocrit was 29.4% in March and 29% in January, with a low MCV of 76.3 fL. Due to declining hemoglobin levels, further evaluation was warranted.  Lab results from January 06, 2024, indicate severe iron  deficiency with an iron  saturation of 3%, ferritin level of 3 ng/mL, and serum iron  at 16 g/dL. The total iron  binding capacity was elevated at 523 g/dL. She has not been on  iron  supplements and only takes medication for her thyroid  condition.  No significant symptoms such as low energy levels, chest pain, dyspnea, fevers, chills, or night sweats. Menstrual cycles last three to four days, and  she denies other bleeding issues such as epistaxis or gum bleeding.  Her diet includes chicken and fish, but she does not consume goat meat. She is aware of the importance of consuming green leafy vegetables and beans for iron  intake. No side effects from current medications.     MEDICAL HISTORY:  Past Medical History:  Diagnosis Date   Chronic back pain    since childbirth, x several years as of 07/2014   Chronic leg pain    left along with hip and back pain since childbirth as of 07/2014   Low back pain 11/15/2015   Medical history non-contributory    Pelvic pain in female 11/15/2015   Thyroid  disease    Upper back pain 11/15/2015    SURGICAL HISTORY: Past Surgical History:  Procedure Laterality Date   CESAREAN SECTION     x3   CESAREAN SECTION N/A 12/03/2016   Procedure: CESAREAN SECTION;  Surgeon: Ozell LITTIE Cowman, MD;  Location: [facility] BIRTHING SUITES;  Service: Obstetrics;  Laterality: N/A;    SOCIAL HISTORY: She reports that she has never smoked. She has never used smokeless tobacco. She reports that she does not drink alcohol and does not use drugs. Social History   Socioeconomic History   Marital status: Married    Spouse name: Not on file   Number of children: Not on file   Years of education: Not on file   Highest education level: Not on file  Occupational History   Not on file  Tobacco Use   Smoking status: Never   Smokeless tobacco: Never  Vaping Use   Vaping status: Never Used  Substance and Sexual Activity   Alcohol use: No    Alcohol/week: 0.0 standard drinks of alcohol   Drug use: No   Sexual activity: Yes    Partners: Male    Birth control/protection: Condom  Other Topics Concern   Not on file  Social History Narrative   Not on file   Social Drivers of Health   Financial Resource Strain: Not on file  Food Insecurity: Not on file  Transportation Needs: Not on file  Physical Activity: Not on file  Stress: Not on file  Social Connections: Not  on file  Intimate Partner Violence: Not on file    FAMILY HISTORY: Family History  Problem Relation Age of Onset   Liver cancer Mother 31   Diabetes Father    Healthy Sister    Healthy Brother    Healthy Son    Healthy Son    Healthy Son    Cancer Neg Hx    Heart disease Neg Hx    Stroke Neg Hx     ALLERGIES:  She has no known allergies.  MEDICATIONS:  Current Outpatient Medications  Medication Sig Dispense Refill   ferrous sulfate  325 (65 FE) MG EC tablet Take 1 tablet (325 mg total) by mouth daily with breakfast. 30 tablet 3   levothyroxine  (SYNTHROID ) 50 MCG tablet Take 1 tablet (50 mcg total) by mouth daily before breakfast. 90 tablet 3   docusate sodium  (COLACE) 100 MG capsule Take 1 capsule (100 mg total) by mouth every 12 (twelve) hours. (Patient not taking: Reported on 01/31/2024) 60 capsule 0   ibuprofen  (ADVIL ) 600 MG tablet Take 1 tablet (600 mg total)  by mouth 2 (two) times daily. (Patient not taking: Reported on 01/31/2024) 20 tablet 0   Multiple Vitamin (MULTIVITAMIN) tablet Take 1 tablet by mouth daily. (Patient not taking: Reported on 01/31/2024)     pantoprazole (PROTONIX) 40 MG tablet Take 40 mg by mouth daily. (Patient not taking: Reported on 01/31/2024)     No current facility-administered medications for this visit.    REVIEW OF SYSTEMS:    Review of Systems - Oncology  All other pertinent systems were reviewed and were negative except as mentioned above.  PHYSICAL EXAMINATION:     Vitals:   01/31/24 1019  BP: (!) 125/97  Pulse: 92  Resp: 16  Temp: (!) 97.4 F (36.3 C)  SpO2: 100%   Filed Weights   01/31/24 1019  Weight: 134 lb 8 oz (61 kg)    Physical Exam Constitutional:      General: She is not in acute distress.    Appearance: Normal appearance.  HENT:     Head: Normocephalic and atraumatic.  Eyes:     General: No scleral icterus.    Conjunctiva/sclera: Conjunctivae normal.  Cardiovascular:     Rate and Rhythm: Normal rate and  regular rhythm.  Pulmonary:     Effort: Pulmonary effort is normal.  Abdominal:     General: There is no distension.  Musculoskeletal:     Right lower leg: No edema.     Left lower leg: No edema.  Neurological:     General: No focal deficit present.     Mental Status: She is alert and oriented to person, place, and time.  Psychiatric:        Mood and Affect: Mood normal.        Behavior: Behavior normal.        Thought Content: Thought content normal.     LABORATORY DATA:   I have reviewed the data as listed.  Results for orders placed or performed in visit on 01/31/24  Folate  Result Value Ref Range   Folate 13.2 >5.9 ng/mL  CBC with Differential (Cancer Center Only)  Result Value Ref Range   WBC Count 6.6 4.0 - 10.5 K/uL   RBC 4.05 3.87 - 5.11 MIL/uL   Hemoglobin 8.5 (L) 12.0 - 15.0 g/dL   HCT 71.5 (L) 63.9 - 53.9 %   MCV 70.1 (L) 80.0 - 100.0 fL   MCH 21.0 (L) 26.0 - 34.0 pg   MCHC 29.9 (L) 30.0 - 36.0 g/dL   RDW 82.0 (H) 88.4 - 84.4 %   Platelet Count 264 150 - 400 K/uL   nRBC 0.0 0.0 - 0.2 %   Neutrophils Relative % 65 %   Neutro Abs 4.4 1.7 - 7.7 K/uL   Lymphocytes Relative 27 %   Lymphs Abs 1.8 0.7 - 4.0 K/uL   Monocytes Relative 6 %   Monocytes Absolute 0.4 0.1 - 1.0 K/uL   Eosinophils Relative 1 %   Eosinophils Absolute 0.0 0.0 - 0.5 K/uL   Basophils Relative 1 %   Basophils Absolute 0.0 0.0 - 0.1 K/uL   WBC Morphology MORPHOLOGY UNREMARKABLE    Smear Review LARGE PLATELETS    Immature Granulocytes 0 %   Abs Immature Granulocytes 0.01 0.00 - 0.07 K/uL   Polychromasia PRESENT    Ovalocytes PRESENT   CMP (Cancer Center only)  Result Value Ref Range   Sodium 137 135 - 145 mmol/L   Potassium 3.9 3.5 - 5.1 mmol/L   Chloride 108 98 - 111 mmol/L  CO2 23 22 - 32 mmol/L   Glucose, Bld 88 70 - 99 mg/dL   BUN 8 6 - 20 mg/dL   Creatinine 9.43 9.55 - 1.00 mg/dL   Calcium 9.3 8.9 - 89.6 mg/dL   Total Protein 7.9 6.5 - 8.1 g/dL   Albumin 4.7 3.5 - 5.0 g/dL    AST 14 (L) 15 - 41 U/L   ALT 12 0 - 44 U/L   Alkaline Phosphatase 43 38 - 126 U/L   Total Bilirubin 0.4 0.0 - 1.2 mg/dL   GFR, Estimated >39 >39 mL/min   Anion gap 6 5 - 15  Iron  and Iron  Binding Capacity ([facility] only)  Result Value Ref Range   Iron  20 (L) 28 - 170 ug/dL   TIBC 362 (H) 749 - 549 ug/dL   Saturation Ratios 3 (L) 10.4 - 31.8 %   UIBC 617 (H) 148 - 442 ug/dL  Ferritin  Result Value Ref Range   Ferritin 3 (L) 11 - 307 ng/mL  Vitamin B12  Result Value Ref Range   Vitamin B-12 240 180 - 914 pg/mL     RADIOGRAPHIC STUDIES:  No recent pertinent imaging studies available to review.  Orders Placed This Encounter  Procedures   CBC with Differential (Cancer Center Only)    Standing Status:   Future    Number of Occurrences:   1    Expiration Date:   01/30/2025   CMP (Cancer Center only)    Standing Status:   Future    Number of Occurrences:   1    Expiration Date:   01/30/2025   Iron  and Iron  Binding Capacity ([facility] only)    Standing Status:   Future    Number of Occurrences:   1    Expiration Date:   01/30/2025   Ferritin    Standing Status:   Future    Number of Occurrences:   1    Expiration Date:   01/30/2025   Vitamin B12    Standing Status:   Future    Number of Occurrences:   1    Expiration Date:   01/30/2025   Folate    Standing Status:   Future    Number of Occurrences:   1    Expiration Date:   01/30/2025   CBC with Differential (Cancer Center Only)    Standing Status:   Future    Expected Date:   06/02/2024    Expiration Date:   01/30/2025   Iron  and Iron  Binding Capacity ([facility] only)    Standing Status:   Future    Expected Date:   06/02/2024    Expiration Date:   01/30/2025   Ferritin    Standing Status:   Future    Expected Date:   06/02/2024    Expiration Date:   01/30/2025    Future Appointments  Date Time Provider Department Center  06/05/2024 10:15 AM [facility] None  06/05/2024 10:45 AM Taleen Prosser, MD [facility] None      I spent a total of 55 minutes during this encounter with the patient including review of chart and various tests results, discussions about plan of care and coordination of care plan.  This document was completed utilizing speech recognition software. Grammatical errors, random word insertions, pronoun errors, and incomplete sentences are an occasional consequence of this system due to software limitations, ambient noise, and hardware issues. Any formal questions or concerns about the content, text or information contained within the  body of this dictation should be directly addressed to the provider for clarification.  "

## 2024-02-03 ENCOUNTER — Encounter: Payer: Self-pay | Admitting: Oncology

## 2024-02-03 DIAGNOSIS — E039 Hypothyroidism, unspecified: Secondary | ICD-10-CM | POA: Insufficient documentation

## 2024-02-03 DIAGNOSIS — D500 Iron deficiency anemia secondary to blood loss (chronic): Secondary | ICD-10-CM | POA: Insufficient documentation

## 2024-02-03 NOTE — Assessment & Plan Note (Signed)
 Hypothyroidism managed with levothyroxine .  Continue follow-up with PCP for medication adjustment and monitoring.

## 2024-02-03 NOTE — Assessment & Plan Note (Signed)
 Chronic iron  deficiency anemia likely due to cumulative menstrual blood loss. Hemoglobin decreased from 9.3 in January to 8.6 in March. Iron  studies indicate severe deficiency with iron  saturation at 3%, ferritin at 3, and iron  at 16. No other bleeding sources identified. Discussed dietary iron  sources and its role in hemoglobin synthesis.   Labs today revealed persistent iron  deficiency anemia with hemoglobin of 8.5, hematocrit 28.4, MCV 70.1.  Ferritin decreased at 3, iron  decreased at 20, iron  saturation decreased at 3%.  Discussed treatment options: oral iron  supplementation and intravenous iron  infusion.  Patient and her husband chose oral iron  due to convenience and preference. Discussed potential side effects: constipation, dark stools, nausea. Advised taking iron  with vitamin C to enhance absorption. IV iron  offers faster response but requires multiple infusion center visits. - Prescribe oral iron  tablets once daily with vitamin C to enhance absorption. - Order blood work to recheck blood counts and iron  levels. - Reassess in 3-4 months to evaluate treatment response.

## 2024-05-13 DIAGNOSIS — M2550 Pain in unspecified joint: Secondary | ICD-10-CM | POA: Diagnosis not present

## 2024-05-13 DIAGNOSIS — D500 Iron deficiency anemia secondary to blood loss (chronic): Secondary | ICD-10-CM | POA: Diagnosis not present

## 2024-05-13 DIAGNOSIS — Z131 Encounter for screening for diabetes mellitus: Secondary | ICD-10-CM | POA: Diagnosis not present

## 2024-05-13 DIAGNOSIS — Z0001 Encounter for general adult medical examination with abnormal findings: Secondary | ICD-10-CM | POA: Diagnosis not present

## 2024-05-13 DIAGNOSIS — D509 Iron deficiency anemia, unspecified: Secondary | ICD-10-CM | POA: Diagnosis not present

## 2024-05-13 DIAGNOSIS — R7303 Prediabetes: Secondary | ICD-10-CM | POA: Diagnosis not present

## 2024-05-13 DIAGNOSIS — Z136 Encounter for screening for cardiovascular disorders: Secondary | ICD-10-CM | POA: Diagnosis not present

## 2024-05-13 DIAGNOSIS — N946 Dysmenorrhea, unspecified: Secondary | ICD-10-CM | POA: Diagnosis not present

## 2024-05-13 DIAGNOSIS — R1013 Epigastric pain: Secondary | ICD-10-CM | POA: Diagnosis not present

## 2024-05-13 DIAGNOSIS — E039 Hypothyroidism, unspecified: Secondary | ICD-10-CM | POA: Diagnosis not present

## 2024-06-05 ENCOUNTER — Telehealth: Payer: Self-pay

## 2024-06-05 ENCOUNTER — Inpatient Hospital Stay: Attending: Internal Medicine

## 2024-06-05 ENCOUNTER — Telehealth: Payer: Self-pay | Admitting: Oncology

## 2024-06-05 ENCOUNTER — Inpatient Hospital Stay: Admitting: Oncology

## 2024-06-05 NOTE — Telephone Encounter (Signed)
 Followed up with patient related to missed lab and Oncology appt with Dr. Autumn. Patient understands that a scheduler will reach out to her to reschedule.

## 2024-06-05 NOTE — Telephone Encounter (Signed)
 I attempted to re-schedule Chelsea Cook. [id number] interpretor ID- [id number] requesting Chelsea Cook to return my call for scheduling.

## 2024-06-17 DIAGNOSIS — D500 Iron deficiency anemia secondary to blood loss (chronic): Secondary | ICD-10-CM | POA: Diagnosis not present

## 2024-06-17 DIAGNOSIS — N946 Dysmenorrhea, unspecified: Secondary | ICD-10-CM | POA: Diagnosis not present

## 2024-06-17 DIAGNOSIS — R1013 Epigastric pain: Secondary | ICD-10-CM | POA: Diagnosis not present

## 2024-06-17 DIAGNOSIS — E039 Hypothyroidism, unspecified: Secondary | ICD-10-CM | POA: Diagnosis not present

## 2024-08-14 DIAGNOSIS — N946 Dysmenorrhea, unspecified: Secondary | ICD-10-CM | POA: Diagnosis not present

## 2024-08-14 DIAGNOSIS — D500 Iron deficiency anemia secondary to blood loss (chronic): Secondary | ICD-10-CM | POA: Diagnosis not present

## 2024-08-14 DIAGNOSIS — R1013 Epigastric pain: Secondary | ICD-10-CM | POA: Diagnosis not present

## 2024-08-14 DIAGNOSIS — E039 Hypothyroidism, unspecified: Secondary | ICD-10-CM | POA: Diagnosis not present
